# Patient Record
Sex: Male | Born: 1960 | Race: White | Hispanic: No | Marital: Single | Smoking: Current every day smoker
Health system: Southern US, Community
[De-identification: ages and names within clinical notes are randomized; demographics above are authoritative.]

## PROBLEM LIST (undated history)

## (undated) DIAGNOSIS — F319 Bipolar disorder, unspecified: Secondary | ICD-10-CM

## (undated) DIAGNOSIS — G709 Myoneural disorder, unspecified: Secondary | ICD-10-CM

## (undated) DIAGNOSIS — J309 Allergic rhinitis, unspecified: Secondary | ICD-10-CM

## (undated) DIAGNOSIS — I10 Essential (primary) hypertension: Secondary | ICD-10-CM

## (undated) DIAGNOSIS — M4310 Spondylolisthesis, site unspecified: Secondary | ICD-10-CM

## (undated) DIAGNOSIS — K449 Diaphragmatic hernia without obstruction or gangrene: Secondary | ICD-10-CM

## (undated) DIAGNOSIS — C801 Malignant (primary) neoplasm, unspecified: Secondary | ICD-10-CM

## (undated) DIAGNOSIS — M1990 Unspecified osteoarthritis, unspecified site: Secondary | ICD-10-CM

## (undated) DIAGNOSIS — F1420 Cocaine dependence, uncomplicated: Secondary | ICD-10-CM

## (undated) DIAGNOSIS — E291 Testicular hypofunction: Secondary | ICD-10-CM

## (undated) DIAGNOSIS — F988 Other specified behavioral and emotional disorders with onset usually occurring in childhood and adolescence: Secondary | ICD-10-CM

## (undated) DIAGNOSIS — F1994 Other psychoactive substance use, unspecified with psychoactive substance-induced mood disorder: Secondary | ICD-10-CM

## (undated) DIAGNOSIS — E785 Hyperlipidemia, unspecified: Secondary | ICD-10-CM

## (undated) DIAGNOSIS — G4733 Obstructive sleep apnea (adult) (pediatric): Secondary | ICD-10-CM

## (undated) DIAGNOSIS — F1020 Alcohol dependence, uncomplicated: Secondary | ICD-10-CM

## (undated) DIAGNOSIS — M48061 Spinal stenosis, lumbar region without neurogenic claudication: Secondary | ICD-10-CM

## (undated) HISTORY — PX: SHOULDER SURGERY: SHX246

## (undated) HISTORY — PX: OTHER SURGICAL HISTORY: SHX169

---

## 2004-05-11 ENCOUNTER — Ambulatory Visit: Payer: Self-pay | Admitting: Psychiatry

## 2004-05-11 ENCOUNTER — Inpatient Hospital Stay (HOSPITAL_COMMUNITY): Admission: RE | Admit: 2004-05-11 | Discharge: 2004-05-16 | Payer: Self-pay | Admitting: Psychiatry

## 2004-05-23 ENCOUNTER — Inpatient Hospital Stay (HOSPITAL_COMMUNITY): Admission: RE | Admit: 2004-05-23 | Discharge: 2004-05-28 | Payer: Self-pay | Admitting: Psychiatry

## 2008-04-21 ENCOUNTER — Emergency Department (HOSPITAL_COMMUNITY): Admission: EM | Admit: 2008-04-21 | Discharge: 2008-04-22 | Payer: Self-pay | Admitting: Emergency Medicine

## 2008-04-22 ENCOUNTER — Inpatient Hospital Stay (HOSPITAL_COMMUNITY): Admission: EM | Admit: 2008-04-22 | Discharge: 2008-04-29 | Payer: Self-pay | Admitting: Psychiatry

## 2008-04-22 ENCOUNTER — Ambulatory Visit: Payer: Self-pay | Admitting: Psychiatry

## 2010-09-04 NOTE — H&P (Signed)
Colin Kelley, Colin Kelley NO.:  [id number]   MEDICAL RECORD NO.:  [id number]          PATIENT TYPE:  IPS   LOCATION:  0507                          FACILITY:  BH   PHYSICIAN:  Geoffery Lyons, M.D.      DATE OF BIRTH:  21-Jan-1961   DATE OF ADMISSION:  04/22/2008  DATE OF DISCHARGE:                       PSYCHIATRIC ADMISSION ASSESSMENT   IDENTIFYING INFORMATION/JUSTIFICATION FOR ADMISSION AND CARE:  This is a  50 year old single white male who presented to the emergency department  at [facility]. He reported that he was using drugs, living on  the streets, and was suicidal with a plan to overdose or wreck his car.  Today, he is asking for long term substance abuse treatment and he  stated that he relapsed on drugs while in a half way house in [place],  which precipitated his crisis.   CURRENT MEDICATIONS:  He is currently on Zoloft, Neurontin, and an  antihistamine for anxiety. This would be Vistaril.   PAST PSYCHIATRIC HISTORY:  He was most recently at a [facility] half way  house in [place]. He has been treated as an outpatient on and off  since 2000. He reports having had substance abuse issues for at least 15  years.   SOCIAL HISTORY:  He has a GED. He has never married. He has no children.  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 His parents live in  [place] and he expects them to be coming to visit today.   FAMILY HISTORY:  He denies.   ALCOHOL/DRUG HISTORY:  Is already noted. He reports issues with cocaine  for about 15 years.   PRIMARY CARE PHYSICIAN:  He currently does not have one.   PAST MEDICAL HISTORY:  1. Hypertension.  2. Asthma.  3. Sleep apnea. He is in the process of getting a CPAP.   MEDICATIONS:  Coming to [place] from the half way house, he reports:  1. Gabapentin 900 mg p.o. q.h.s.  2. Tuatara 40 mg p.o. each morning.  3. Singulair 10 mg p.o. daily.  4. Benadryl unknown.  5. Vistaril unknown.  6. Lovastatin 4 mg  p.o. daily.  7. Zyrtec 10 mg p.o. daily.  8. Nortriptyline 30 mg q.h.s.  9. Zoloft 50 mg q.h.s.  10.Prilosec 20 mg b.i.d.  11.Albuterol inhaler, no specifications given.   ALLERGIES:  SULFA.   PHYSICAL EXAMINATION:  He was medically cleared in the emergency  department at [facility].  VITAL SIGNS:  On admission to our unit showed he is 5 foot 8. Weight 222  1/4 pounds. Temperature 98.3, blood pressure was 155/104 to 122/80. His  pulse is 107 to 115 and respiratory rate 16 to 20. Temperature  originally was 99.4.   LABORATORY DATA:  His UDS is positive for cocaine. He had no alcohol.  His other labs were unremarkable.   MENTAL STATUS EXAM:  He was seen in his room. He was in bed after lunch.  He was alert and oriented. He was appropriately groomed, dressed, and  nourished. His speech was not pressured. His mood was appropriate to  the  situation. His thought processes were clear, rational, and goal  oriented. He did ask for a long term substance abuse treatment program.  Judgment and insight were fair. Concentration and memory superficially  are intact. Intelligence is average. He is not actively suicidal or  homicidal, although he is not sure that he can contact outside the  hospital yet. That is because he would relapse on drugs. He does not  have any auditory visual hallucinations.   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  AXIS II:    Deferred.  AXIS III:   History for hypertension, asthma, and allergies.  AXIS IV:    Severe, financial and substance abuse issues.  AXIS V:     35.   PLAN:  We will admit for safety and stabilization. His medications will  be adjusted as indicated. Our case manager will help identify post-  discharge placement and treatment.      Mickie Leonarda Salon, P.A.-C.      Geoffery Lyons, M.D.   Electronically Signed    MD/MEDQ  D:  04/23/2008  T:  04/23/2008  Job:  [id number]

## 2010-09-07 NOTE — H&P (Signed)
NAMEJASPER, Colin Kelley NO.:  [id number]   MEDICAL RECORD NO.:  [id number]          PATIENT TYPE:  IPS   LOCATION:  0304                          FACILITY:  BH   PHYSICIAN:  Colin Kelley, M.D. DATE OF BIRTH:  24-Feb-1961   DATE OF ADMISSION:  05/11/2004  DATE OF DISCHARGE:                         PSYCHIATRIC ADMISSION ASSESSMENT   This is a voluntary admission to the services of Dr. Jeanice Kelley.   IDENTIFYING STATEMENT:  This is a 50 year old single white male.  Apparently  he presented to [facility] on Thursday, requesting rehab.  He had relapsed on  cocaine.  He also acknowledged suicidal ideation so they would not admit  him. He then went to the [facility] where he was  assessed and eventually admitted here to the [facility].  The  patient relapsed on crack a few weeks ago. He lost his job. He has had to  move in with his parents.  He is also enrolled at an electrical course at  [employer] at this time. Today, he states that he is not has hopeless. He would  like to be discharged to an [facility].  He wants meds to help with focus  and concentration. He states that that is the reason that he lost his job  and he also has sleep issues.  He is also concerned about lack of libido. He  wants his meds assessed for this.  His admitting glucose was slightly  elevated.  His hemoglobin A1c is also slightly elevated at 6.5.  This was  reviewed with him and he was informed that we will request a consult to see  if he needs to be medicated or if this can just be managed through diet and  exercise.   PAST PSYCHIATRIC HISTORY:  He has had numerous detoxifications and rehabs.  He has had 7 admissions in and around the [place] area.  He was admitted to  [facility] here in [place] about 10 years ago.  He states that he does go to [employer] regularly.   SOCIAL HISTORY:  He went to the tenth grade.  He has a GED.  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  He also has been employed  as a Cabin crew, and things like that. He has never married. He has  no children.   FAMILY HISTORY:  On the maternal side there is depression and both  grandfathers had alcoholism.   ALCOHOL AND DRUG HISTORY:  He began drinking at age 36, cocaine at age 51.   MEDICAL PRIMARY CARE Colin Kelley:  Dr. __________ in [place].   MEDICAL PROBLEMS:  Hypertension, asthma, hypercholesterolemia.  He had  removal of a testicular tumor in 2004 and a prostatectomy in 2005.   CURRENT MEDICATIONS:  1.  Albuterol 2 puffs p.r.n.  2.  Diovan 160/25 daily.  3.  Questor 10 mg daily.  4.  __________  40 mg daily.  5.  Seroquel 100 mg h.s. and Lexapro 20 mg daily.   DRUG ALLERGIES:  His drug allergies are to  SULFA and Naprosyn. He states  that he had a rash.   PHYSICAL EXAMINATION:  Unremarkable.  He does have surgical scars consistent  with his history for prostatectomy and testicular tumor.  Otherwise there  were no remarkable findings today.   MENTAL STATUS EXAM:  He is alert and oriented x3.  He is appropriately  groomed and dressed.  Gait and motor are normal.  Eye contact is good.  His  speech has a normal rate, rhythm, and tone.  It is not pressured.  He states  his mood is medium. His affect shows an appropriate range and it is  appropriate to the situation.  Thought processes were clear, rational, and  goal oriented.  He knows he wants to be discharged to a half-way house.  He  specifically requested an [facility].  Judgment and insight are intact.  Concentration and memory are intact.  Intelligence is at least average.  Today he denies being as suicidal.  He is not homicidal and he denies  auditory or visual hallucinations.   AXIS I:  1.  Alcohol and cocaine dependence, recent relapse. He has been cocaine      dependent for 10-15 years by his admission.  2.  Major depressive disorder, recurrent severe, without  psychosis.   AXIS II:  Deferred.   AXIS III:  1.  Hypertension.  2.  Asthma.  3.  Increased cholesterol.  4.  Status post testicular cancer.   AXIS IV:  1.  Severe problems with primary support group.  2.  Problems related to social environment.  3.  Occupational problem.  4.  Housing problem.  5.  Economic problem.   AXIS V:  20-25.   PLAN:  He will be admitted for safety and stabilization.  We will adjust his  medications as indicated.  Towards that end, a mood disorder questionnaire  was administered and it is highly suggestive that a mood stabilizer might be  indicated.      MD/MEDQ  D:  05/12/2004  T:  05/12/2004  Job:  [id number]

## 2010-09-07 NOTE — Discharge Summary (Signed)
NAMEAKEEL, REFFNER NO.:  [id number]   MEDICAL RECORD NO.:  [id number]          PATIENT TYPE:  IPS   LOCATION:  0303                          FACILITY:  BH   PHYSICIAN:  Jeanice Lim, M.D. DATE OF BIRTH:  May 30, 1960   DATE OF ADMISSION:  05/23/2004  DATE OF DISCHARGE:  05/28/2004                                 DISCHARGE SUMMARY   IDENTIFYING DATA:  This is a 50 year old single Caucasian male voluntarily  admitted.  Presenting with a history of suicidal ideation with no specific  plan.  Overdosed on crack cocaine, three-day binge.  Reported compliance  with medications.   PAST PSYCHIATRIC HISTORY:  This is his second admission here to [facility].  Was here about a week ago.  History of bipolar disorder,  suicidal ideation and crack cocaine use.   SUBSTANCE ABUSE:  Frequent use of crack cocaine.   ALLERGIES:  SULFA medications, ALEVE and SHELLFISH.   PHYSICAL EXAMINATION:  Physical and neurologic exam within normal limits.   LABORATORY DATA:  Routine admission labs within normal limits.   MENTAL STATUS EXAM:  Alert, middle-aged male.  Cooperative.  Fair eye  contact.  Speech clear.  Mood depressed, tired.  Affect restricted.  Thought  processes goal directed.  No psychotic symptoms.  Cognitively intact.  Judgment and insight are fair.  The patient has a history of poor impulse  control and mood instability.   ADMISSION DIAGNOSES:   AXIS I:  1.  Bipolar disorder.  2.  Cocaine dependence versus abuse.  3.  Rule out substance-induced mood disorder superimposed on bipolar      disorder.   AXIS II:  Deferred.   AXIS III:  Hypertension.   AXIS IV:  Moderate (problems with limited support system).   AXIS V:  35/60.   HOSPITAL COURSE:  The patient was admitted and ordered routine p.r.n.  medications and underwent further monitoring.  Was encouraged to participate  in individual, group and milieu therapy.  Was monitored for safety  and  participated in substance abuse treatment.  After a day of being admitted,  he reported being depressed but not actively suicidal.  He reported he saw  his outpatient psychiatrist, Dr. Tomasa Rand, yesterday and now is prescribed  valproic acid.  Affect was flat, very tired, no appetite.  Apparently, the  patient's stay was paid with down payment initially with cash of $1500 due  to being denied further days due to number of days used, so patient has  history of frequent admissions.  On the day after admission, the patient  reported continued suicidal thoughts, passive with no intent.  Reported  that, instead of working his relapse prevention plan and going to a halfway  house, he stayed at his parents, got money and then went and used crack  cocaine on a three-day binge and became very suicidal.  Reported that he has  now been diagnosed bipolar, which there is history of previous diagnosis but  patient was adamant he was not bipolar in the past.  Now appearing to feel  more comfortable with  his diagnosis.  The patient looked into [facility], site, worked with aftercare arrangements with the casemanagement and was  referred to multiple [employer], halfway houses, three-quarter houses and  recovery work programs since the crack cocaine appears to be his primary  destabilizer.  The patient described feeling irritable and angry, reported  that he did not like Seroquel.  Dr. Milas Hock office was contacted and  plan was apparently to decrease Lexapro and Strattera as well as Seroquel  and increase Depakote and possibly Lamictal.  [facility] was also  contacted.  The patient described feeling sedated with increase in Depakote  but felt some improvement.  Then plans to continue follow-up at [facility]  and was agreeable to go to a 14-day program due to severity of his addiction  and the impact this has on his mood disorder and the impact the mood  disorder may have on his  ability to stay abstinent.  The patient continued  to be somewhat isolative at times, sleeping at some times, feeling mildly  sedated from the Depakote.  Otherwise, showed improvement.  Family session  was held.  The patient reported feeling a little bit better as his mood was  a little more stable and there is decreased irritability with a decrease in  mixed symptoms.  No suicidal or homicidal ideation.  No psychotic symptoms.  No acute cravings.  Increased insight and judgment.  Educated regarding the  importance of compliance with medications and impact of cocaine, life-  threatening in itself and possibly life-threatening due to impact on  medications and mood.  The patient verbally appeared to be motivated at the  time of the previous discharge. However, is agreeing to residential at this  time.   CONDITION ON DISCHARGE:  He was discharged in improved condition.  Mood was  more euthymic, less irritability, no mood swings, no racing thoughts, no  report of significant depressive symptoms.  No suicidal or homicidal  ideation.  No psychotic symptoms.  Improved judgment and insight.  Thought  processes very goal directed and reality-based.  Able to problem-solve.  Given medication education including importance of compliance with  medications and monitoring with Depakote.   DISCHARGE MEDICATIONS:  1.  Protonix 40 mg a day.  2.  Diovan 160 mg.  3.  Hydrochlorothiazide 25 mg daily.  4.  Claritin 10 mg daily.  5.  Neurontin 300 mg, 2 q.h.s.  6.  Lexapro decreased to 10 mg daily.  The patient was reluctant to come off      of this and wanted to be continued on a higher dose.  However, he was      advised, to decrease mood instability and possible induction, it would      be best to be on a minimal dose and possibly consider decreasing it to 5      mg after being seen by his outpatient psychiatrist.  7.  Nasonex 1 spray each nostril daily as previously directed. 8.  Depakote 500 mg,  taking 2 q.h.s.  This may need to gradually be titrated      but this could not be titrated further inpatient due to patient's      sedation.   FOLLOW UP:  However, to retake therapeutic level depending on mood  instability, the patient may benefit from further gradual increases in dose  and close monitoring due to mood instability as well as intensive substance  abuse treatment, which he will get a [facility]  [place], dual-diagnosis  residential program and then will likely need a halfway house as a stepdown,  continued psychiatric follow-up in addition to individual therapy, focusing  on substances as well as mood disorder.   DISCHARGE DIAGNOSES:   AXIS I:  1.  Bipolar disorder, type 1, mixed state.  2.  Cocaine abuse with history of possible dependence.  3.  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   AXIS II:  Deferred.   AXIS III:  Hypertension.   AXIS IV:  Moderate (problems with limited support system).   AXIS V:  Global Assessment of Functioning on discharge 55-60.      JEM/MEDQ  D:  06/24/2004  T:  06/25/2004  Job:  [id number]

## 2010-09-07 NOTE — Discharge Summary (Signed)
Colin Kelley, Colin Kelley NO.:  [id number]   MEDICAL RECORD NO.:  [id number]          PATIENT TYPE:  IPS   LOCATION:  0507                          FACILITY:  BH   PHYSICIAN:  Geoffery Lyons, M.D.      DATE OF BIRTH:  03-Nov-1960   DATE OF ADMISSION:  04/22/2008  DATE OF DISCHARGE:  04/29/2008                               DISCHARGE SUMMARY   CHIEF COMPLAINT:  This was the first admission to [facility] for this 50 year old single white male who presented to the  emergency department.  He was using drugs, living on the streets, was  suicidal.  Planned to overdose or wreck his car.  At the time of this  assessment, asking for long term substance abuse treatment.  He relapsed  on drugs while in a halfway house in [place], which precipitated his  crisis.   PAST PSYCHIATRIC HISTORY:  Has been treated as an outpatient on and off  since 2000.  Had been prescribed Zoloft, Neurontin and Vistaril for  anxiety.   SECONDARY HISTORY:  Has had issues with substance abuse for at least 15  years, mostly cocaine.   MEDICAL HISTORY:  Hypertension, asthma, sleep apnea.   MEDICATIONS:  1. Neurontin 900 mg at night.  2. Singulair 10 mg per day.  3. Benadryl.  4. Vistaril as needed.  5. Lovastatin 4 mg per day.  6. Zyrtec 10 mg per day.  7. Nortriptyline 30 mg at night.  8. Zoloft 50 mg at night.  9. Prilosec 20 mg twice a day.  10.Albuterol as needed.  11.Strattera 40 mg per day.   EXAM:  Failed to show any acute findings.   LABORATORY WORK:  UDS positive for cocaine.  Other labs were within  normal limits..   MENTAL STATUS EXAM:  Reveals alert cooperative male in bed but alert,  oriented, appropriately groomed, dressed and nourished.  Speech was  normal in the in rate, tempo and production.  Mood is anxious,  depressed.  Affect broad.  Thought processes logical, coherent and  relevant.  No active delusions.  No active suicidal ideas, no  hallucinations.  Cognition well preserved.   ADMITTING DIAGNOSES:  AXIS I:  Alcohol and cocaine dependence, major  depressive disorder.  AXIS II:  No diagnosis.  AXIS III:  Hypertension, asthma, allergies.  AXIS IV:  Moderate.  AXIS V:  On admission 35.  Highest GAF in the last year, 60.   COURSE IN THE HOSPITAL:  He was admitted.  He was started individual and  group psychotherapy.  He was maintained on his medications, Abilify was  placed at 10 mg in the morning.  He was started on Lamictal 25 mg per  day.  As already stated, a relapse on drugs while in a halfway house in  [place].  As the hospitalization progressed, he reports poor sleep.  He endorsed that he was dealing with a lot of stress.  Endorsed he has  issues with the substances, frequent relapses, multiple rehabs, multiple  medication trials.  Has been on Paxil, Prozac, Zoloft, Celexa, Lexapro,  Effexor, Wellbutrin, Cymbalta, Seroquel, Neurontin.  Endorsed he gets  really agitated when the dose of the antidepressant is increased.  She  cannot tolerate the right dosage but does well on the lower ones and he  admitted to issues with work, anxiety, also longer term issues with  sexual identity, as well as not being able to commit to people.  He felt  that the Abilify was not agreeing with him, producing agitation even in  the morning, and endorsed that he counteracts whatever Strattera is  trying to do for him.  Difficult time with attention span.  Had been  applying to [facility].  January 6, endorsed he was better off the Abilify,  waiting for some response from [facility] or [facility].  Endorsed that he  really needs to make it to residential treatment program.  Continued to  endorse he was better off the Abilify.  Some lack of energy and  motivation, but overall getting better.  We worked on Pharmacologist and  relapse prevention.  January 8 he was in full contact with reality.  He  got a response from [facility] that he could go in.   He was encouraged by  this, motivated.  He was discharged.  Family was going to take him to  [facility] to get his things and take him to [facility] in [place]  for a long-term rehab.   DISCHARGE DIAGNOSES:  AXIS I: Cocaine dependence.  Mood disorder NOS.  AXIS II:  No diagnosis.  AXIS III:  Hypertension, asthma, allergies.  AXIS IV:  Moderate.  AXIS V:  On discharge 55.   DISCHARGE MEDICATIONS:  1. Neurontin 300 three times a day.  2. Zoloft 50 mg at bedtime.  3. Singulair 10 mg per day.  4. Claritin 10 mg per day.  5. Protonix 40 mg twice a day.  6. Zocor 20 mg per day.  7. Lisinopril 20 mg per day.  8. Lamictal 25 mg per day.   FOLLOW UP:  [facility] in [place].      Geoffery Lyons, M.D.  Electronically Signed     IL/MEDQ  D:  05/19/2008  T:  05/19/2008  Job:  [id number]

## 2010-09-07 NOTE — Discharge Summary (Signed)
NAMENYXON, STRUPP NO.:  [id number]   MEDICAL RECORD NO.:  [id number]          PATIENT TYPE:  IPS   LOCATION:  0303                          FACILITY:  [facility]   PHYSICIAN:  Jeanice Lim, M.D. DATE OF BIRTH:  [birth year]   DATE OF ADMISSION:  05/23/2004  DATE OF DISCHARGE:  05/28/2004                                 DISCHARGE SUMMARY   IDENTIFYING DATA:  This is a 50 year old single, Caucasian male presenting  to [facility] on Thursday requesting rehab.  Relapsed on cocaine.  Acknowledge  suicidal ideation.  They did not admit him.  Went to [facility] and essentially moved to [facility].  Relapsed  on crack a few weeks ago.  Lost a job.  Had to move in with his parents.  Would like to be discharged to an [facility].  Reporting motivation to  become abstinent.   PAST PSYCHIATRIC HISTORY:  Numerous detoxes and rehabs.  Seven admissions  around the [facility] area and admitted to [facility] here in [place] 10 years ago.  Reported he does attend AA meetings.   ALCOHOL/DRUG HISTORY:  Began drinking at age 69.  Cocaine age 16.   CURRENT MEDICATIONS:  Albuterol, Diovan, Crestor, Protonix, Seroquel and  Lexapro.   ALLERGIES:  SULFA medications and NAPROSYN.   PHYSICAL EXAMINATION:  Physical and neurologic exam essentially within  normal limits.   LABORATORY DATA:  Routine admission labs essentially within normal limits.   MENTAL STATUS EXAM:  Alert and oriented.  Appropriately dressed and groomed.  Gait within normal limits.  Eye contact good.  Speech within normal limits,  not pressured.  Mood depressed.  Affect appropriate.  Thought processes goal  directed.  Specifically requesting [employer].  Appearing to have insight  regarding the severity of his addiction and impact on life.  Denied being  suicidal 24-48 hours after admission and denied homicidal or psychotic  symptoms.   ADMISSION DIAGNOSES:   AXIS I:  1.  Alcohol  and cocaine dependence.  2.  Recent relapse.  3.  Cocaine dependent for 10-15 years by history.  4.  Major depressive disorder, recurrent, severe without psychosis.  5.  Rule out substance-induced mood disorder.   AXIS II:  Deferred.   AXIS III:  1.  Hypertension.  2.  Asthma.  3.  Elevated cholesterol.  4.  Status post testicular cancer.   AXIS IV:  Severe (problems with primary support group, related to social  environment, occupational, housing, economic, medical).   AXIS V:  25/55.   HOSPITAL COURSE:  The patient was admitted and ordered routine p.r.n.  medications and underwent further monitoring.  Was encouraged to participate  in individual, group and milieu therapy.  The patient was monitored for  safety and participated in substance abuse therapy, developing a relapse  prevention plan.  Was placed on low dose Librium detox protocol and  monitored for safety.  Tolerated this.  Was restarted on medical medication  and monitored closely medically as well as psychiatrically.  CIWAs were  checked on a frequent basis and Neurontin  used for pain and sleep cycle  restored.  Acyclovir and Abreva for a coldsore.  Seroquel was decreased to  lowest effective dose and Neurontin optimized.  The patient reported  positive response to medication changes.   CONDITION ON DISCHARGE:  Discharged in improved condition.  Mood was  euthymic.  Affect full.  Thought processes goal directed.  Thought content  negative for dangerous ideation or psychotic symptoms.  The patient reported  motivation to be compliant with the aftercare plan.   DISCHARGE MEDICATIONS:  1.  Seroquel 25 mg at 9 p.m.  2.  Nasonex 2 sprays q.d.  3.  Motrin 600 mg t.i.d.  4.  Percocet 2 q.8h. p.r.n. pain.  5.  Ultram 50 mg, 2 q.8h. p.r.n. pain.  6.  Lexapro 20 mg q.a.m.  7.  Protonix 40 mg q.a.m.  8.  Neurontin 300 mg, 2 at 9 p.m.  9.  Strattera 60 mg q.a.m.  10. Zyrtec 10 mg q.a.m.  11. Avapro 300 mg q.a.m.  12.  Hydrochlorothiazide q.a.m.  13. Zocor 40 mg at 6 p.m.   FOLLOW UP:  The patient was to follow up with Dr. Tomasa Rand on Tuesday,  January 31st at 10 a.m. and Eyvonne Mechanic on May 25, 2004 at 2:30 p.m.   DISCHARGE DIAGNOSES:   AXIS I:  1.  Alcohol and cocaine dependence.  2.  Recent relapse.  3.  Cocaine dependent for 10-15 years by history.  4.  Major depressive disorder, recurrent, severe without psychosis.  5.  Rule out substance-induced mood disorder.   AXIS II:  Deferred.   AXIS III:  1.  Hypertension.  2.  Asthma.  3.  Elevated cholesterol.  4.  Status post testicular cancer.   AXIS IV:  Severe (problems with primary support group, related to social  environment, occupational, housing, economic, medical).   AXIS V:  Global Assessment of Functioning on discharge 55.      JEM/MEDQ  D:  06/20/2004  T:  06/21/2004  Job:  [id number]

## 2011-01-25 LAB — POCT I-STAT, CHEM 8
BUN: 29 mg/dL — ABNORMAL HIGH (ref 6–23)
Calcium, Ion: 1.19 mmol/L (ref 1.12–1.32)
Glucose, Bld: 93 mg/dL (ref 70–99)
TCO2: 22 mmol/L (ref 0–100)

## 2011-01-25 LAB — RAPID URINE DRUG SCREEN, HOSP PERFORMED
Barbiturates: NOT DETECTED
Cocaine: POSITIVE — AB
Opiates: NOT DETECTED

## 2011-01-25 LAB — DIFFERENTIAL
Basophils Absolute: 0.1 10*3/uL (ref 0.0–0.1)
Basophils Relative: 1 % (ref 0–1)
Eosinophils Relative: 0 % (ref 0–5)
Lymphocytes Relative: 24 % (ref 12–46)
Monocytes Absolute: 0.7 10*3/uL (ref 0.1–1.0)
Monocytes Relative: 6 % (ref 3–12)

## 2011-01-25 LAB — CBC
HCT: 47.9 % (ref 39.0–52.0)
Hemoglobin: 16.4 g/dL (ref 13.0–17.0)
MCHC: 34.2 g/dL (ref 30.0–36.0)
RDW: 12.7 % (ref 11.5–15.5)

## 2015-09-13 ENCOUNTER — Emergency Department (HOSPITAL_COMMUNITY): Payer: BLUE CROSS/BLUE SHIELD

## 2015-09-13 ENCOUNTER — Encounter (HOSPITAL_COMMUNITY): Payer: Self-pay | Admitting: Emergency Medicine

## 2015-09-13 ENCOUNTER — Emergency Department (HOSPITAL_COMMUNITY)
Admission: EM | Admit: 2015-09-13 | Discharge: 2015-09-13 | Disposition: A | Discharge status: Home / Self Care | Payer: BLUE CROSS/BLUE SHIELD | Attending: Emergency Medicine | Admitting: Emergency Medicine

## 2015-09-13 DIAGNOSIS — S82852A Displaced trimalleolar fracture of left lower leg, initial encounter for closed fracture: Secondary | ICD-10-CM | POA: Insufficient documentation

## 2015-09-13 DIAGNOSIS — Z7951 Long term (current) use of inhaled steroids: Secondary | ICD-10-CM | POA: Diagnosis not present

## 2015-09-13 DIAGNOSIS — I10 Essential (primary) hypertension: Secondary | ICD-10-CM | POA: Diagnosis not present

## 2015-09-13 DIAGNOSIS — W11XXXA Fall on and from ladder, initial encounter: Secondary | ICD-10-CM | POA: Diagnosis not present

## 2015-09-13 DIAGNOSIS — Z79899 Other long term (current) drug therapy: Secondary | ICD-10-CM | POA: Diagnosis not present

## 2015-09-13 DIAGNOSIS — Y9389 Activity, other specified: Secondary | ICD-10-CM | POA: Diagnosis not present

## 2015-09-13 DIAGNOSIS — Y9289 Other specified places as the place of occurrence of the external cause: Secondary | ICD-10-CM | POA: Insufficient documentation

## 2015-09-13 DIAGNOSIS — Z8709 Personal history of other diseases of the respiratory system: Secondary | ICD-10-CM | POA: Insufficient documentation

## 2015-09-13 DIAGNOSIS — Z8739 Personal history of other diseases of the musculoskeletal system and connective tissue: Secondary | ICD-10-CM | POA: Insufficient documentation

## 2015-09-13 DIAGNOSIS — T1490XA Injury, unspecified, initial encounter: Secondary | ICD-10-CM

## 2015-09-13 DIAGNOSIS — Z8669 Personal history of other diseases of the nervous system and sense organs: Secondary | ICD-10-CM | POA: Insufficient documentation

## 2015-09-13 DIAGNOSIS — S82853A Displaced trimalleolar fracture of unspecified lower leg, initial encounter for closed fracture: Secondary | ICD-10-CM

## 2015-09-13 DIAGNOSIS — Z8639 Personal history of other endocrine, nutritional and metabolic disease: Secondary | ICD-10-CM | POA: Insufficient documentation

## 2015-09-13 DIAGNOSIS — Z8659 Personal history of other mental and behavioral disorders: Secondary | ICD-10-CM | POA: Insufficient documentation

## 2015-09-13 DIAGNOSIS — Y998 Other external cause status: Secondary | ICD-10-CM | POA: Diagnosis not present

## 2015-09-13 DIAGNOSIS — Z87891 Personal history of nicotine dependence: Secondary | ICD-10-CM | POA: Insufficient documentation

## 2015-09-13 DIAGNOSIS — S9305XA Dislocation of left ankle joint, initial encounter: Secondary | ICD-10-CM | POA: Insufficient documentation

## 2015-09-13 DIAGNOSIS — S99912A Unspecified injury of left ankle, initial encounter: Secondary | ICD-10-CM | POA: Diagnosis present

## 2015-09-13 HISTORY — DX: Alcohol dependence, uncomplicated: F10.20

## 2015-09-13 HISTORY — DX: Other specified behavioral and emotional disorders with onset usually occurring in childhood and adolescence: F98.8

## 2015-09-13 HISTORY — DX: Allergic rhinitis, unspecified: J30.9

## 2015-09-13 HISTORY — DX: Testicular hypofunction: E29.1

## 2015-09-13 HISTORY — DX: Cocaine dependence, uncomplicated: F14.20

## 2015-09-13 HISTORY — DX: Diaphragmatic hernia without obstruction or gangrene: K44.9

## 2015-09-13 HISTORY — DX: Hyperlipidemia, unspecified: E78.5

## 2015-09-13 HISTORY — DX: Other psychoactive substance use, unspecified with psychoactive substance-induced mood disorder: F19.94

## 2015-09-13 HISTORY — DX: Spondylolisthesis, site unspecified: M43.10

## 2015-09-13 HISTORY — DX: Obstructive sleep apnea (adult) (pediatric): G47.33

## 2015-09-13 HISTORY — DX: Essential (primary) hypertension: I10

## 2015-09-13 HISTORY — DX: Spinal stenosis, lumbar region without neurogenic claudication: M48.061

## 2015-09-13 MED ORDER — HYDROCODONE-ACETAMINOPHEN 5-325 MG PO TABS: 1.0000 | ORAL_TABLET | ORAL | Status: DC | PRN

## 2015-09-13 MED ORDER — PROPOFOL 10 MG/ML IV BOLUS
50.0000 mg | INTRAVENOUS | Status: DC | PRN
  Administered 2015-09-13: 50 mg via INTRAVENOUS
  Filled 2015-09-13: qty 20

## 2015-09-13 MED ORDER — HYDROMORPHONE HCL 1 MG/ML IJ SOLN
1.0000 mg | INTRAMUSCULAR | Status: AC | PRN
  Administered 2015-09-13 (×3): 1 mg via INTRAVENOUS
  Filled 2015-09-13 (×3): qty 1

## 2015-09-13 MED ORDER — SODIUM CHLORIDE 0.9 % IV SOLN
INTRAVENOUS | Status: DC
  Administered 2015-09-13: 21:00:00 via INTRAVENOUS

## 2015-09-13 NOTE — Progress Notes (Signed)
Orthopedic Tech Progress Note Patient Details:  Colin Kelley 17-Jul-1960 [id number]  Ortho Devices Type of Ortho Device: Stirrup splint, Post (short leg) splint Ortho Device/Splint Location: lle posterior and stirrup. assist dr with ankle reduction. Ortho Device/Splint Interventions: Ordered, Application   Karolee Stamps 09/13/2015, 10:53 PM

## 2015-09-13 NOTE — ED Provider Notes (Signed)
CSN: [id number]     Arrival date & time 09/13/15  2040 History   First MD Initiated Contact with Patient 09/13/15 2044     Chief Complaint  Patient presents with  . Foot Injury   HPI Patient presents to the emergency room for evaluation of the left ankle injury. The patient was working on a ladder when he accidently fell off and injured his left ankle. Patient noted a deformity. He was unable to put any weight on it he called EMS. EMS noted a left ankle deformity and placed him in a splint. He was given 200 g of fentanyl. Patient was transported to the emergency room for further evaluation Past Medical History  Diagnosis Date  . Hypertension   . Substance induced mood disorder ([place])   . 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 Attention deficit disorder without hyperactivity   . Dyslipidemia, goal LDL below 130   . Allergic rhinitis   . Hypogonadism male   . OSA (obstructive sleep apnea)   . Spinal stenosis of lumbar region without neurogenic claudication   . Spondylolisthesis   . Cocaine use disorder, severe, dependence ([place])   . Alcohol use disorder, severe, in controlled environment [facility])    Past Surgical History  Procedure Laterality Date  . Prostectomy    . Shoulder surgery     History reviewed. No pertinent family history. Social History  Substance Use Topics  . Smoking status: Former Smoker    Quit date: 09/13/2014  . Smokeless tobacco: Current User    Types: Chew  . Alcohol Use: No    Review of Systems  All other systems reviewed and are negative.     Allergies  Shellfish-derived products; Fructose; and Sulfa antibiotics  Home Medications   Prior to Admission medications   Medication Sig Start Date End Date Taking? Authorizing Provider  albuterol (PROAIR HFA) 108 (90 Base) MCG/ACT inhaler Inhale 2 puffs into the lungs every 6 (six) hours as needed. Shortness of breath 05/29/11  Yes Historical Provider, MD  azelastine (ASTELIN) 0.1 % nasal  spray Place 2 sprays into both nostrils daily. 07/11/15  Yes Historical Provider, MD  fluticasone furoate-vilanterol (BREO ELLIPTA) 100-25 MCG/INH AEPB Inhale 1 puff into the lungs daily. 07/11/15  Yes Historical Provider, MD  gabapentin (NEURONTIN) 800 MG tablet Take 800 mg by mouth 2 (two) times daily. 08/08/15  Yes Historical Provider, MD  lisinopril (PRINIVIL,ZESTRIL) 40 MG tablet Take 40 mg by mouth daily. 09/05/15 09/04/16 Yes Historical Provider, MD  lithium carbonate 300 MG capsule Take 300-600 mg by mouth daily. Take 300 mg every morning Take 600 mg at bedtime 07/03/15  Yes Historical Provider, MD  Phosphatidylserine-DHA-EPA (VAYACOG) 100-19.5-6.5 MG CAPS Take 1 capsule by mouth daily. 08/10/15  Yes Historical Provider, MD  QUEtiapine (SEROQUEL) 50 MG tablet Take 50 mg by mouth at bedtime. 08/25/15  Yes Historical Provider, MD   BP 146/95 mmHg  Pulse 79  Temp(Src) 97.8 F (36.6 C) (Oral)  Resp 20  Ht 5\' 7"  (1.702 m)  Wt 106.595 kg  BMI 36.80 kg/m2  SpO2 100% Physical Exam  Constitutional: He appears well-developed and well-nourished. No distress.  HENT:  Head: Normocephalic and atraumatic.  Right Ear: External ear normal.  Left Ear: External ear normal.  Eyes: Conjunctivae are normal. Right eye exhibits no discharge. Left eye exhibits no discharge. No scleral icterus.  Neck: Neck supple. No tracheal deviation present.  Cardiovascular: Normal rate and regular rhythm.   Pulmonary/Chest: Effort normal and breath  sounds normal. No stridor. No respiratory distress.  Abdominal: Soft. Bowel sounds are normal. He exhibits no distension. There is no tenderness. There is no rebound.  Musculoskeletal: He exhibits no edema.       Right knee: Normal.       Left knee: Normal.       Right ankle: Normal.       Left ankle: He exhibits decreased range of motion, swelling and deformity. He exhibits no laceration and normal pulse. Tenderness. Lateral malleolus and medial malleolus tenderness found.   Neurological: He is alert. Cranial nerve deficit: no gross deficits.  Skin: Skin is warm and dry. No rash noted.  Psychiatric: He has a normal mood and affect.  Nursing note and vitals reviewed.   ED Course  .Sedation Date/Time: 09/13/2015 9:49 PM Performed by: Dorie Rank Authorized by: Dorie Rank  Consent:    Consent obtained:  Verbal   Consent given by:  Patient   Alternatives discussed:  Analgesia without sedation Indications:    Sedation purpose:  Fracture reduction   Procedure necessitating sedation performed by:  Physician performing sedation   Intended level of sedation:  Deep Pre-sedation assessment:    Time since last food or drink:  5 hours   ASA classification: class 2 - patient with mild systemic disease     Neck mobility: normal     Mouth opening:  3 or more finger widths   Thyromental distance:  4 finger widths   Mallampati score:  I - soft palate, uvula, fauces, pillars visible   Pre-sedation assessments completed and reviewed: airway patency, cardiovascular function, hydration status, mental status, nausea/vomiting, pain level, respiratory function and temperature     Pre-sedation assessment completed:  09/13/2015 9:55 PM Immediate pre-procedure details:    Reassessment: Patient reassessed immediately prior to procedure     Reviewed: vital signs and NPO status     Verified: bag valve mask available, emergency equipment available, IV patency confirmed, oxygen available and suction available   Procedure details (see MAR for exact dosages):    Sedation start time:  09/13/2015 10:15 PM   Preoxygenation:  Nasal cannula   Sedation:  Propofol   Intra-procedure monitoring:  Blood pressure monitoring, cardiac monitor, continuous pulse oximetry, frequent LOC assessments and frequent vital sign checks   Intra-procedure events: none     Sedation end time:  09/13/2015 10:23 PM   Total sedation time (minutes):  8 Post-procedure details:    Post-sedation assessment completed:   09/13/2015 11:09 PM   Recovery: Patient returned to pre-procedure baseline     Post-sedation assessments completed and reviewed: airway patency, cardiovascular function, hydration status, mental status, nausea/vomiting, pain level, respiratory function and temperature     Patient is stable for discharge or admission: Yes     Patient tolerance:  Tolerated well, no immediate complications Reduction of fracture Date/Time: 09/13/2015 11:10 PM Performed by: Dorie Rank Authorized by: Dorie Rank Consent: Verbal consent obtained. Risks and benefits: risks, benefits and alternatives were discussed Consent given by: patient Time out: Immediately prior to procedure a "time out" was called to verify the correct patient, procedure, equipment, support staff and site/side marked as required. Patient sedated: yes Patient tolerance: Patient tolerated the procedure well with no immediate complications Comments: Reduction by Dr Tamala Julian and myself.  Pt tolerated well.  Splinted post procedure    Labs Review Labs Reviewed - No data to display  Imaging Review Dg Ankle Left Port  09/13/2015  CLINICAL DATA:  Status post reduction of trimalleolar  fracture. Initial encounter. EXAM: PORTABLE LEFT ANKLE - 2 VIEW COMPARISON:  Left ankle radiographs performed earlier today at 9:30 p.m. FINDINGS: There is improved alignment of the trimalleolar fracture. The posterior malleolar fragment is no longer well seen. There has been successful reduction of the comminuted distal fibular diaphyseal fracture. There is improved though persistent displacement of the medial malleolar fracture, with residual lateral tilt of the talus, lodged against the lateral aspect of the tibial plafond. A plantar calcaneal spur is noted. The soft tissues are difficult to fully assess due to the surrounding splint. IMPRESSION: Improved alignment of the trimalleolar fracture. Posterior malleolar fragment no longer well seen. Successful reduction of  comminuted distal fibular diaphyseal fracture. Improved though persistent displacement of the medial malleolar fracture, with residual lateral tilt of the talus, lodged against the lateral aspect of the tibial plafond. Electronically Signed   By: Garald Balding M.D.   On: 09/13/2015 22:53   Dg Ankle Left Port  09/13/2015  CLINICAL DATA:  Left ankle pain and deformity after injury. Fall off 8 ft ladder while painting today. EXAM: PORTABLE LEFT ANKLE - 2 VIEW COMPARISON:  None. FINDINGS: Displaced trimalleolar fracture. Transverse fracture through the medial malleolus, distal fragment remains aligned with the talus. Displaced posterior tibial tubercle fracture. Comminuted displaced distal fibular fracture proximal to the ankle mortise. Significant lateral talar subluxation and apex medial talar tilt. Associated soft tissue edema. There is a plantar calcaneal spur. IMPRESSION: Significantly displaced trimalleolar fracture with significant lateral talar subluxation and talar tilt. Electronically Signed   By: Jeb Levering M.D.   On: 09/13/2015 21:50   I have personally reviewed and evaluated these images and lab results as part of my medical decision-making.    MDM   Final diagnoses:  Injury  Trimalleolar fracture of ankle, closed   Pt tolerated procedure.  Better alignment of the ankle fracture.  Will splint.  Crutches.  Outpatient follow up with orthopedics,  Dr Minerva Ends, MD 09/14/15 1018

## 2015-09-13 NOTE — ED Notes (Signed)
Pt arrives by EMS post fall. Pt was painting on approximately 4ft ladder. Fell off, landed on bottom and left side. Obvious deformity to left ankle reported by EMS, positive pulses in left extremity. Pain rated 8/10. EMS placed 18g in right AC, gave a total of 200 mcg Fentanyl. O2 dropped a little so they put him on 2L.

## 2015-09-13 NOTE — Discharge Instructions (Signed)
Ankle Fracture A fracture is a break in a bone. The ankle joint is made up of three bones. These include the lower (distal)sections of your lower leg bones, called the tibia and fibula, along with a bone in your foot, called the talus. Depending on how bad the break is and if more than one ankle joint bone is broken, a cast or splint is used to protect and keep your injured bone from moving while it heals. Sometimes, surgery is required to help the fracture heal properly.  There are two general types of fractures:  Stable fracture. This includes a single fracture line through one bone, with no injury to ankle ligaments. A fracture of the talus that does not have any displacement (movement of the bone on either side of the fracture line) is also stable.  Unstable fracture. This includes more than one fracture line through one or more bones in the ankle joint. It also includes fractures that have displacement of the bone on either side of the fracture line. CAUSES  A direct blow to the ankle.   Quickly and severely twisting your ankle.  Trauma, such as a car accident or falling from a significant height. RISK FACTORS You may be at a higher risk of ankle fracture if:  You have certain medical conditions.  You are involved in high-impact sports.  You are involved in a high-impact car accident. SIGNS AND SYMPTOMS   Tender and swollen ankle.  Bruising around the injured ankle.  Pain on movement of the ankle.  Difficulty walking or putting weight on the ankle.  A cold foot below the site of the ankle injury. This can occur if the blood vessels passing through your injured ankle were also damaged.  Numbness in the foot below the site of the ankle injury. DIAGNOSIS  An ankle fracture is usually diagnosed with a physical exam and X-rays. A CT scan may also be required for complex fractures. TREATMENT  Stable fractures are treated with a cast or splint and using crutches to avoid putting  weight on your injured ankle. This is followed by an ankle strengthening program. Some patients require a special type of cast, depending on other medical problems they may have. Unstable fractures require surgery to ensure the bones heal properly. Your health care provider will tell you what type of fracture you have and the best treatment for your condition. HOME CARE INSTRUCTIONS   Review correct crutch use with your health care provider and use your crutches as directed. Safe use of crutches is extremely important. Misuse of crutches can cause you to fall or cause injury to nerves in your hands or armpits.  Do not put weight or pressure on the injured ankle until directed by your health care provider.  To lessen the swelling, keep the injured leg elevated while sitting or lying down.  Apply ice to the injured area:  Put ice in a plastic bag.  Place a towel between your cast and the bag.  Leave the ice on for 20 minutes, 2-3 times a day.  If you have a plaster or fiberglass cast:  Do not try to scratch the skin under the cast with any objects. This can increase your risk of skin infection.  Check the skin around the cast every day. You may put lotion on any red or sore areas.  Keep your cast dry and clean.  If you have a plaster splint:  Wear the splint as directed.  You may loosen the elastic   around the splint if your toes become numb, tingle, or turn cold or blue.  Do not put pressure on any part of your cast or splint; it may break. Rest your cast only on a pillow the first 24 hours until it is fully hardened.  Your cast or splint can be protected during bathing with a plastic bag sealed to your skin with medical tape. Do not lower the cast or splint into water.  Take medicines as directed by your health care provider. Only take over-the-counter or prescription medicines for pain, discomfort, or fever as directed by your health care provider.  Do not drive a vehicle until  your health care provider specifically tells you it is safe to do so.  If your health care provider has given you a follow-up appointment, it is very important to keep that appointment. Not keeping the appointment could result in a chronic or permanent injury, pain, and disability. If you have any problem keeping the appointment, call the facility for assistance. SEEK MEDICAL CARE IF: You develop increased swelling or discomfort. SEEK IMMEDIATE MEDICAL CARE IF:   Your cast gets damaged or breaks.  You have continued severe pain.  You develop new pain or swelling after the cast was put on.  Your skin or toenails below the injury turn blue or gray.  Your skin or toenails below the injury feel cold, numb, or have loss of sensitivity to touch.  There is a bad smell or pus draining from under the cast. MAKE SURE YOU:   Understand these instructions.  Will watch your condition.  Will get help right away if you are not doing well or get worse.   This information is not intended to replace advice given to you by your health care provider. Make sure you discuss any questions you have with your health care provider.   Document Released: 04/05/2000 Document Revised: 04/13/2013 Document Reviewed: 11/05/2012 Elsevier Interactive Patient Education 2016 [employer]  

## 2016-11-28 DIAGNOSIS — R3914 Feeling of incomplete bladder emptying: Secondary | ICD-10-CM | POA: Diagnosis not present

## 2016-11-28 DIAGNOSIS — M62838 Other muscle spasm: Secondary | ICD-10-CM | POA: Diagnosis not present

## 2016-11-28 DIAGNOSIS — M6281 Muscle weakness (generalized): Secondary | ICD-10-CM | POA: Diagnosis not present

## 2016-11-28 DIAGNOSIS — R152 Fecal urgency: Secondary | ICD-10-CM | POA: Diagnosis not present

## 2016-11-28 DIAGNOSIS — R3916 Straining to void: Secondary | ICD-10-CM | POA: Diagnosis not present

## 2016-12-06 DIAGNOSIS — F3181 Bipolar II disorder: Secondary | ICD-10-CM | POA: Diagnosis not present

## 2016-12-08 ENCOUNTER — Emergency Department (HOSPITAL_COMMUNITY): Payer: Medicare Other

## 2016-12-08 ENCOUNTER — Inpatient Hospital Stay (HOSPITAL_COMMUNITY)
Admission: EM | Admit: 2016-12-08 | Discharge: 2016-12-12 | DRG: 917 | Disposition: A | Discharge status: Home Health | Payer: Medicare Other | Attending: Internal Medicine | Admitting: Internal Medicine

## 2016-12-08 DIAGNOSIS — Z6832 Body mass index (BMI) 32.0-32.9, adult: Secondary | ICD-10-CM | POA: Diagnosis not present

## 2016-12-08 DIAGNOSIS — E785 Hyperlipidemia, unspecified: Secondary | ICD-10-CM | POA: Diagnosis not present

## 2016-12-08 DIAGNOSIS — T50904A Poisoning by unspecified drugs, medicaments and biological substances, undetermined, initial encounter: Secondary | ICD-10-CM | POA: Diagnosis not present

## 2016-12-08 DIAGNOSIS — L89891 Pressure ulcer of other site, stage 1: Secondary | ICD-10-CM | POA: Diagnosis not present

## 2016-12-08 DIAGNOSIS — T56894A Toxic effect of other metals, undetermined, initial encounter: Secondary | ICD-10-CM

## 2016-12-08 DIAGNOSIS — N179 Acute kidney failure, unspecified: Secondary | ICD-10-CM

## 2016-12-08 DIAGNOSIS — Z781 Physical restraint status: Secondary | ICD-10-CM | POA: Diagnosis not present

## 2016-12-08 DIAGNOSIS — G9341 Metabolic encephalopathy: Secondary | ICD-10-CM | POA: Diagnosis present

## 2016-12-08 DIAGNOSIS — G4733 Obstructive sleep apnea (adult) (pediatric): Secondary | ICD-10-CM | POA: Diagnosis present

## 2016-12-08 DIAGNOSIS — R4182 Altered mental status, unspecified: Secondary | ICD-10-CM

## 2016-12-08 DIAGNOSIS — F319 Bipolar disorder, unspecified: Secondary | ICD-10-CM | POA: Diagnosis not present

## 2016-12-08 DIAGNOSIS — Z881 Allergy status to other antibiotic agents status: Secondary | ICD-10-CM

## 2016-12-08 DIAGNOSIS — T43591A Poisoning by other antipsychotics and neuroleptics, accidental (unintentional), initial encounter: Secondary | ICD-10-CM | POA: Diagnosis present

## 2016-12-08 DIAGNOSIS — R2681 Unsteadiness on feet: Secondary | ICD-10-CM | POA: Diagnosis present

## 2016-12-08 DIAGNOSIS — Y92009 Unspecified place in unspecified non-institutional (private) residence as the place of occurrence of the external cause: Secondary | ICD-10-CM

## 2016-12-08 DIAGNOSIS — L8990 Pressure ulcer of unspecified site, unspecified stage: Secondary | ICD-10-CM | POA: Insufficient documentation

## 2016-12-08 DIAGNOSIS — N390 Urinary tract infection, site not specified: Secondary | ICD-10-CM | POA: Diagnosis present

## 2016-12-08 DIAGNOSIS — Z79891 Long term (current) use of opiate analgesic: Secondary | ICD-10-CM

## 2016-12-08 DIAGNOSIS — G253 Myoclonus: Secondary | ICD-10-CM | POA: Diagnosis present

## 2016-12-08 DIAGNOSIS — Z91018 Allergy to other foods: Secondary | ICD-10-CM

## 2016-12-08 DIAGNOSIS — F1910 Other psychoactive substance abuse, uncomplicated: Secondary | ICD-10-CM | POA: Diagnosis not present

## 2016-12-08 DIAGNOSIS — R0603 Acute respiratory distress: Secondary | ICD-10-CM

## 2016-12-08 DIAGNOSIS — E872 Acidosis: Secondary | ICD-10-CM | POA: Diagnosis present

## 2016-12-08 DIAGNOSIS — E876 Hypokalemia: Secondary | ICD-10-CM | POA: Diagnosis present

## 2016-12-08 DIAGNOSIS — I10 Essential (primary) hypertension: Secondary | ICD-10-CM | POA: Diagnosis present

## 2016-12-08 DIAGNOSIS — M6282 Rhabdomyolysis: Secondary | ICD-10-CM | POA: Diagnosis present

## 2016-12-08 DIAGNOSIS — T56891A Toxic effect of other metals, accidental (unintentional), initial encounter: Secondary | ICD-10-CM | POA: Diagnosis not present

## 2016-12-08 DIAGNOSIS — T56892A Toxic effect of other metals, intentional self-harm, initial encounter: Secondary | ICD-10-CM | POA: Diagnosis not present

## 2016-12-08 DIAGNOSIS — X58XXXD Exposure to other specified factors, subsequent encounter: Secondary | ICD-10-CM | POA: Diagnosis present

## 2016-12-08 DIAGNOSIS — Z791 Long term (current) use of non-steroidal anti-inflammatories (NSAID): Secondary | ICD-10-CM | POA: Diagnosis not present

## 2016-12-08 DIAGNOSIS — F1020 Alcohol dependence, uncomplicated: Secondary | ICD-10-CM | POA: Diagnosis present

## 2016-12-08 DIAGNOSIS — F3111 Bipolar disorder, current episode manic without psychotic features, mild: Secondary | ICD-10-CM | POA: Diagnosis not present

## 2016-12-08 DIAGNOSIS — E869 Volume depletion, unspecified: Secondary | ICD-10-CM | POA: Diagnosis present

## 2016-12-08 DIAGNOSIS — R011 Cardiac murmur, unspecified: Secondary | ICD-10-CM | POA: Diagnosis present

## 2016-12-08 DIAGNOSIS — F17290 Nicotine dependence, other tobacco product, uncomplicated: Secondary | ICD-10-CM | POA: Diagnosis present

## 2016-12-08 DIAGNOSIS — S82852D Displaced trimalleolar fracture of left lower leg, subsequent encounter for closed fracture with routine healing: Secondary | ICD-10-CM | POA: Diagnosis not present

## 2016-12-08 DIAGNOSIS — E669 Obesity, unspecified: Secondary | ICD-10-CM | POA: Diagnosis present

## 2016-12-08 DIAGNOSIS — Z452 Encounter for adjustment and management of vascular access device: Secondary | ICD-10-CM | POA: Diagnosis not present

## 2016-12-08 DIAGNOSIS — R4781 Slurred speech: Secondary | ICD-10-CM | POA: Diagnosis not present

## 2016-12-08 DIAGNOSIS — Z91013 Allergy to seafood: Secondary | ICD-10-CM

## 2016-12-08 DIAGNOSIS — N529 Male erectile dysfunction, unspecified: Secondary | ICD-10-CM | POA: Diagnosis present

## 2016-12-08 DIAGNOSIS — Z79899 Other long term (current) drug therapy: Secondary | ICD-10-CM

## 2016-12-08 DIAGNOSIS — F988 Other specified behavioral and emotional disorders with onset usually occurring in childhood and adolescence: Secondary | ICD-10-CM | POA: Diagnosis present

## 2016-12-08 DIAGNOSIS — S82892A Other fracture of left lower leg, initial encounter for closed fracture: Secondary | ICD-10-CM | POA: Diagnosis not present

## 2016-12-08 DIAGNOSIS — F3160 Bipolar disorder, current episode mixed, unspecified: Secondary | ICD-10-CM | POA: Diagnosis not present

## 2016-12-08 DIAGNOSIS — J9811 Atelectasis: Secondary | ICD-10-CM | POA: Diagnosis not present

## 2016-12-08 DIAGNOSIS — Z87891 Personal history of nicotine dependence: Secondary | ICD-10-CM | POA: Diagnosis not present

## 2016-12-08 LAB — CBC WITH DIFFERENTIAL/PLATELET
Basophils Absolute: 0 10*3/uL (ref 0.0–0.1)
Basophils Relative: 0 %
EOS ABS: 0 10*3/uL (ref 0.0–0.7)
Eosinophils Relative: 0 %
HEMATOCRIT: 42.4 % (ref 39.0–52.0)
HEMOGLOBIN: 14 g/dL (ref 13.0–17.0)
LYMPHS ABS: 0.8 10*3/uL (ref 0.7–4.0)
LYMPHS PCT: 4 %
MCH: 30.4 pg (ref 26.0–34.0)
MCHC: 33 g/dL (ref 30.0–36.0)
MCV: 92 fL (ref 78.0–100.0)
MONOS PCT: 7 %
Monocytes Absolute: 1.4 10*3/uL — ABNORMAL HIGH (ref 0.1–1.0)
NEUTROS ABS: 17.2 10*3/uL — AB (ref 1.7–7.7)
NEUTROS PCT: 89 %
Platelets: 178 10*3/uL (ref 150–400)
RBC: 4.61 MIL/uL (ref 4.22–5.81)
RDW: 14 % (ref 11.5–15.5)
WBC: 19.4 10*3/uL — AB (ref 4.0–10.5)

## 2016-12-08 LAB — CBG MONITORING, ED
GLUCOSE-CAPILLARY: 175 mg/dL — AB (ref 65–99)
Glucose-Capillary: 49 mg/dL — ABNORMAL LOW (ref 65–99)
Glucose-Capillary: <10 mg/dL — CL (ref 65–99)

## 2016-12-08 LAB — I-STAT TROPONIN, ED: TROPONIN I, POC: 0.01 ng/mL (ref 0.00–0.08)

## 2016-12-08 LAB — COMPREHENSIVE METABOLIC PANEL
ALK PHOS: 64 U/L (ref 38–126)
ALT: 13 U/L — AB (ref 17–63)
AST: 14 U/L — AB (ref 15–41)
Albumin: 4.1 g/dL (ref 3.5–5.0)
Anion gap: 13 (ref 5–15)
BUN: 54 mg/dL — AB (ref 6–20)
CALCIUM: 9 mg/dL (ref 8.9–10.3)
CHLORIDE: 104 mmol/L (ref 101–111)
CO2: 20 mmol/L — AB (ref 22–32)
CREATININE: 3.87 mg/dL — AB (ref 0.61–1.24)
GFR calc Af Amer: 18 mL/min — ABNORMAL LOW (ref >60)
GFR, EST NON AFRICAN AMERICAN: 16 mL/min — AB (ref >60)
Glucose, Bld: 85 mg/dL (ref 65–99)
Potassium: 3.9 mmol/L (ref 3.5–5.1)
Sodium: 137 mmol/L (ref 135–145)
Total Bilirubin: 1.4 mg/dL — ABNORMAL HIGH (ref 0.3–1.2)
Total Protein: 6.3 g/dL — ABNORMAL LOW (ref 6.5–8.1)

## 2016-12-08 LAB — ETHANOL

## 2016-12-08 LAB — ACETAMINOPHEN LEVEL: Acetaminophen (Tylenol), Serum: <10 ug/mL — ABNORMAL LOW (ref 10–30)

## 2016-12-08 LAB — SALICYLATE LEVEL: Salicylate Lvl: <7 mg/dL (ref 2.8–30.0)

## 2016-12-08 LAB — LITHIUM LEVEL: Lithium Lvl: 2.29 mmol/L (ref 0.60–1.20)

## 2016-12-08 MED ORDER — SODIUM CHLORIDE 0.9 % IV BOLUS (SEPSIS)
1000.0000 mL | Freq: Once | INTRAVENOUS | Status: AC
Start: 2016-12-08 — End: 2016-12-09
  Administered 2016-12-08: 1000 mL via INTRAVENOUS

## 2016-12-08 NOTE — ED Provider Notes (Signed)
[facility] DEPT Provider Note   CSN: [id number] Arrival date & time: 12/08/16  2119     History   Chief Complaint Chief Complaint  Patient presents with  . Altered Mental Status    HPI Colin Kelley is a 56 y.o. male.  The history is provided by the EMS personnel.     Level V caveat: Altered mental status 56 year old male with history of alcohol use disorder, ADD, cocaine use disorder, dyslipidemia, hypertension, sleep apnea, substance-induced mood disorder, presenting to the ED with altered mental status. Father reportedly called EMS as patient was "sleeping too much". States he has been in bed for about 24 hours. They're concerned for potential overdose, apparently some of his home medications were changed about 2 weeks ago (unsure which ones).  Patient unable to provide any further reliable history here.  Past Medical History:  Diagnosis Date  . Alcohol use disorder, severe, in controlled environment ([place])   . Allergic rhinitis   . Attention deficit disorder without hyperactivity   . Cocaine use disorder, severe, dependence ([place])   . 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 Dyslipidemia, goal LDL below 130   . Hypertension   . Hypogonadism male   . OSA (obstructive sleep apnea)   . Spinal stenosis of lumbar region without neurogenic claudication   . Spondylolisthesis   . Substance induced mood disorder ([place])     There are no active problems to display for this patient.   Past Surgical History:  Procedure Laterality Date  . prostectomy    . SHOULDER SURGERY         Home Medications    Prior to Admission medications   Medication Sig Start Date End Date Taking? Authorizing Provider  albuterol (PROAIR HFA) 108 (90 Base) MCG/ACT inhaler Inhale 2 puffs into the lungs every 6 (six) hours as needed. Shortness of breath 05/29/11   [provider]  azelastine (ASTELIN) 0.1 % nasal spray Place 2 sprays into both nostrils daily. 07/11/15    [provider]  fluticasone furoate-vilanterol (BREO ELLIPTA) 100-25 MCG/INH AEPB Inhale 1 puff into the lungs daily. 07/11/15   [provider]  gabapentin (NEURONTIN) 800 MG tablet Take 800 mg by mouth 2 (two) times daily. 08/08/15   [provider]  HYDROcodone-acetaminophen (NORCO/VICODIN) 5-325 MG tablet Take 1 tablet by mouth every 4 (four) hours as needed. 09/13/15   Dorie Rank, MD  lisinopril (PRINIVIL,ZESTRIL) 40 MG tablet Take 40 mg by mouth daily. 09/05/15 09/04/16  [provider]  lithium carbonate 300 MG capsule Take 300-600 mg by mouth daily. Take 300 mg every morning Take 600 mg at bedtime 07/03/15   [provider]  Phosphatidylserine-DHA-EPA (VAYACOG) 100-19.5-6.5 MG CAPS Take 1 capsule by mouth daily. 08/10/15   [provider]  QUEtiapine (SEROQUEL) 50 MG tablet Take 50 mg by mouth at bedtime. 08/25/15   [provider]    Family History No family history on file.  Social History Social History  Substance Use Topics  . Smoking status: Former Smoker    Quit date: 09/13/2014  . Smokeless tobacco: Current User    Types: Chew  . Alcohol use No     Allergies   Shellfish-derived products; Fructose; and Sulfa antibiotics   Review of Systems Review of Systems  Unable to perform ROS: Other     Physical Exam Updated Vital Signs BP 117/64 (BP Location: Right Arm)   Pulse 95   Temp 98.7 F (37.1 C) (Oral)  Resp (!) 25   SpO2 100%   Physical Exam  Constitutional: He appears well-developed and well-nourished.  HENT:  Head: Normocephalic and atraumatic.  Mouth/Throat: Oropharynx is clear and moist.  Eyes: Pupils are equal, round, and reactive to light. Conjunctivae and EOM are normal.  Neck: Normal range of motion.  Cardiovascular: Normal rate, regular rhythm and normal heart sounds.   Pulmonary/Chest: Effort normal and breath sounds normal. No respiratory distress. He has no wheezes.  Abdominal: Soft.  Bowel sounds are normal.  Musculoskeletal: Normal range of motion.  Neurological:  Able to answer some questions and follow some commands; speech is somewhat slurred, intermittently intelligible; fidgeting continuously during exam  Skin: Skin is warm and dry.  Psychiatric: He has a normal mood and affect.  Nursing note and vitals reviewed.    ED Treatments / Results  Labs (all labs ordered are listed, but only abnormal results are displayed) Labs Reviewed  CBC WITH DIFFERENTIAL/PLATELET - Abnormal; Notable for the following:       Result Value   WBC 19.4 (*)    Neutro Abs 17.2 (*)    Monocytes Absolute 1.4 (*)    All other components within normal limits  COMPREHENSIVE METABOLIC PANEL - Abnormal; Notable for the following:    CO2 20 (*)    BUN 54 (*)    Creatinine, Ser 3.87 (*)    Total Protein 6.3 (*)    AST 14 (*)    ALT 13 (*)    Total Bilirubin 1.4 (*)    GFR calc non Af Amer 16 (*)    GFR calc Af Amer 18 (*)    All other components within normal limits  LITHIUM LEVEL - Abnormal; Notable for the following:    Lithium Lvl 2.29 (*)    All other components within normal limits  ACETAMINOPHEN LEVEL - Abnormal; Notable for the following:    Acetaminophen (Tylenol), Serum <10 (*)    All other components within normal limits  CBG MONITORING, ED - Abnormal; Notable for the following:    Glucose-Capillary <10 (*)    All other components within normal limits  CBG MONITORING, ED - Abnormal; Notable for the following:    Glucose-Capillary 49 (*)    All other components within normal limits  CBG MONITORING, ED - Abnormal; Notable for the following:    Glucose-Capillary 175 (*)    All other components within normal limits  ETHANOL  SALICYLATE LEVEL  RAPID URINE DRUG SCREEN, HOSP PERFORMED  URINALYSIS, ROUTINE W REFLEX MICROSCOPIC  CK  I-STAT TROPONIN, ED    EKG  EKG Interpretation  Date/Time:  Sunday December 08 2016 21:27:01 EDT Ventricular Rate:  95 PR Interval:      QRS Duration: 102 QT Interval:  367 QTC Calculation: 462 R Axis:   23 Text Interpretation:  Sinus rhythm No STEMI.  Confirmed by Nanda Quinton [id number]) on 12/08/2016 9:30:29 PM       Radiology Ct Head Wo Contrast  Result Date: 12/08/2016 CLINICAL DATA:  Possible overdose.  Slurred speech. EXAM: CT HEAD WITHOUT CONTRAST TECHNIQUE: Contiguous axial images were obtained from the base of the skull through the vertex without intravenous contrast. COMPARISON:  None. FINDINGS: Brain: No evidence of acute infarction, hemorrhage, hydrocephalus, extra-axial collection or mass lesion/mass effect. Vascular: No hyperdense vessel or unexpected calcification. Skull: Normal. Negative for fracture or focal lesion. Sinuses/Orbits: Mild polypoid mucosal thickening of the maxillary sinuses. Postsurgical changes in the maxillary sinuses. Other: None. IMPRESSION: No acute intracranial abnormality. Electronically Signed  By: Fidela Salisbury M.D.   On: 12/08/2016 22:27   Dg Chest Port 1 View  Result Date: 12/08/2016 CLINICAL DATA:  Altered mental status. EXAM: PORTABLE CHEST 1 VIEW COMPARISON:  Frontal and lateral views 04/21/2008 FINDINGS: Very low lung volumes limit assessment. Probable bibasilar atelectasis. Heart appears prominent in size, likely accentuated by technique. No confluent airspace disease. No large pleural effusion or pneumothorax. Osseous structures are grossly intact. IMPRESSION: Low lung volumes limiting assessment. Bibasilar atelectasis with prominent heart size likely accentuated by portable technique and low lung volumes. Recommend follow-up PA and lateral views based on clinical concern. Electronically Signed   By: Jeb Levering M.D.   On: 12/08/2016 23:10    Procedures Procedures (including critical care time)  CRITICAL CARE Performed by: Larene Pickett   Total critical care time: 45 minutes  Critical care time was exclusive of separately billable procedures and treating other  patients.  Critical care was necessary to treat or prevent imminent or life-threatening deterioration.  Critical care was time spent personally by me on the following activities: development of treatment plan with patient and/or surrogate as well as nursing, discussions with consultants, evaluation of patient's response to treatment, examination of patient, obtaining history from patient or surrogate, ordering and performing treatments and interventions, ordering and review of laboratory studies, ordering and review of radiographic studies, pulse oximetry and re-evaluation of patient's condition.   Medications Ordered in ED Medications - No data to display   Initial Impression / Assessment and Plan / ED Course  I have reviewed the triage vital signs and the nursing notes.  Pertinent labs & imaging results that were available during my care of the patient were reviewed by me and considered in my medical decision making (see chart for details).  56 year old male here with altered mental status. Family at home reported medication changes about 2 weeks ago, unsure which. Here on exam patient is awake but does appear somewhat somnolent. He is able to answer some questions and follow limited commands. Speech is slurred, intermittently intelligible. He is fidgeting continuously during exam. Differential broad at this point. He is on lithium. We'll plan for basic labs, lithium level, CT head, chest x-ray, drug screens.  Lithium level elevated here at 2.29.  Patient also in new renal failure.  Will require admission.  IVF started.  Pharmacy tech has called and spoken with patient's father, he does confirm that some medications were changed, he has the bottles with him and will bring tomorrow. She has called patient's pharmacy, it does appear new prescription for lithium was filled 6 days ago for increased dose of 450 mg, it appears patient also filled prescription for his old 300mg  dose as well.  Question  if patient has been inadvertently taking both?  Discussed with triad hospitalist, Dr. Lorin Mercy-- will admit for further management.  Requests nephrology consult to see if patient should be dialyzed.    11:34 PM Discussed with nephrology, Dr. Hollie Salk-- leaning towards dialysis given his new ARF and elevated lithium level.  She will evaluate in the ED and decide.  Nephrology has evaluated-- Patient will undergo emergent dialysis.  This can likely be temporary but will need to monitor closely.  CK was added to work-up and is pending at this time.  Final Clinical Impressions(s) / ED Diagnoses   Final diagnoses:  Lithium toxicity, undetermined intent, initial encounter  Acute renal failure, unspecified acute renal failure type ([place])  Altered mental status, unspecified altered mental status type  New Prescriptions New Prescriptions   No medications on file     Kathryne Hitch 12/09/16 0003    Margette Fast, MD 12/09/16 [phone]

## 2016-12-08 NOTE — H&P (Signed)
History and Physical    Colin Kelley:[id number] DOB: [birth year] DOA: 12/08/2016  PCP: Ann Held, MD Patient coming from:  Home - lives with parents; NOK: father  Chief Complaint: AMS  HPI: Colin Kelley is a 56 y.o. male with medical history significant of polysubstance abuse; OSA; HTN; HLD; and bipolar disorder presenting with AMS.     HPI per PA Sanders:  56 year old male with history of alcohol use disorder, ADD, cocaine use disorder, dyslipidemia, hypertension, sleep apnea, substance-induced mood disorder, presenting to the ED with altered mental status. Father reportedly called EMS as patient was "sleeping too much". States he has been in bed for about 24 hours. They're concerned for potential overdose, apparently some of his home medications were changed about 2 weeks ago (unsure which ones). Patient unable to provide any further reliable history here.  I called and spoke to his father, who reports that he slept through the night and through the day.  He was "wobbling like he was drunk last week.  Unsteady on his feet.  He would drop things and make a mess" in the kitchen.  His father is planning to bring in his medications in the AM, but he is unsure when his medications were changed.  He came to live with his parents about 2 years ago after he was kicked out of his group home for using drugs; they do not think he has continued to use drugs since moving in with them.  He has had a malunion ankle fracture, which causes him chronic pain.    ED Course:  Patient somnolent, follows limited commands, speech is slurred and intermittently unintelligible.  Patient fidgety.  Lithium level elevated at 2.29 and with new renal failure.  Pharmacy tech reports recent medications changes, with a new rx for lithium at 450 mg dose - he previously took 300 mg qAM and 600 mg qhs.  Nephrology consulted and will do emergent HD.  Review of Systems: Unable to review  Past Medical History:  Diagnosis  Date  . Alcohol use disorder, severe, in controlled environment ([place])   . Allergic rhinitis   . Attention deficit disorder without hyperactivity   . Cocaine use disorder, severe, dependence ([place])   . 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 Dyslipidemia, goal LDL below 130   . Hypertension   . Hypogonadism male   . OSA (obstructive sleep apnea)   . Spinal stenosis of lumbar region without neurogenic claudication   . Spondylolisthesis   . Substance induced mood disorder [facility])     Past Surgical History:  Procedure Laterality Date  . prostectomy    . SHOULDER SURGERY      Social History   Social History  . Marital status: Single    Spouse name: N/A  . Number of children: N/A  . Years of education: N/A   Occupational History  . Not on file.   Social History Main Topics  . Smoking status: Former Smoker    Quit date: 09/13/2014  . Smokeless tobacco: Current User    Types: Chew  . Alcohol use No  . Drug use: No  . Sexual activity: Not on file   Other Topics Concern  . Not on file   Social History Narrative  . No narrative on file    Allergies  Allergen Reactions  . Shellfish-Derived Products Hives  . Fructose Nausea And Vomiting  . Sulfa Antibiotics Rash    No family history on file.  Prior  to Admission medications   Medication Sig Start Date End Date Taking? Authorizing Provider  albuterol (PROAIR HFA) 108 (90 Base) MCG/ACT inhaler Inhale 2 puffs into the lungs every 6 (six) hours as needed. Shortness of breath 05/29/11   [provider]  atorvastatin (LIPITOR) 20 MG tablet Take 20 mg by mouth daily. 12/01/16   [provider]  azelastine (ASTELIN) 0.1 % nasal spray Place 2 sprays into both nostrils daily. 07/11/15   [provider]  azelastine (OPTIVAR) 0.05 % ophthalmic solution Place into both eyes. 11/17/16   [provider]  fluticasone furoate-vilanterol (BREO ELLIPTA) 100-25 MCG/INH AEPB Inhale 1 puff into  the lungs daily. 07/11/15   [provider]  gabapentin (NEURONTIN) 800 MG tablet Take 800 mg by mouth 2 (two) times daily. 08/08/15   [provider]  HYDROcodone-acetaminophen (NORCO/VICODIN) 5-325 MG tablet Take 1 tablet by mouth every 4 (four) hours as needed. 09/13/15   Dorie Rank, MD  lamoTRIgine (LAMICTAL) 200 MG tablet Take 200 mg by mouth daily. 11/30/16   [provider]  lisinopril (PRINIVIL,ZESTRIL) 40 MG tablet Take 40 mg by mouth daily. 09/05/15 09/04/16  [provider]  lithium carbonate (ESKALITH) 450 MG CR tablet Take 450 mg by mouth. 11/27/16   [provider]  lithium carbonate (LITHOBID) 300 MG CR tablet Take 300 mg by mouth. 11/21/16   [provider]  meloxicam (MOBIC) 7.5 MG tablet Take 7.5 mg by mouth. 10/22/16   [provider]  Phosphatidylserine-DHA-EPA (VAYACOG) 100-19.5-6.5 MG CAPS Take 1 capsule by mouth daily. 08/10/15   [provider]  polyethylene glycol powder (GLYCOLAX/MIRALAX) powder Take 17 g by mouth. 10/20/16   [provider]  propranolol ER (INDERAL LA) 60 MG 24 hr capsule Take 60 mg by mouth daily. 12/06/16   [provider]  QUEtiapine (SEROQUEL XR) 300 MG 24 hr tablet Take 300 mg by mouth daily. 11/30/16   [provider]  tadalafil (CIALIS) 20 MG tablet Take 20 mg by mouth daily as needed for erectile dysfunction.    [provider]  traZODone (DESYREL) 50 MG tablet Take 50 mg by mouth. 12/06/16   [provider]  zolpidem (AMBIEN) 10 MG tablet Take 10 mg by mouth. 11/27/16   [provider]    Physical Exam: Vitals:   12/08/16 2315 12/08/16 2330 12/09/16 0000 12/09/16 0015  BP: (!) 102/49 110/80 113/60 120/65  Pulse: 94 95 99 98  Resp: (!) 28 (!) 25  19  Temp:      TempSrc:      SpO2: 93% 95% 99% 95%     General: Appears altered - somnolent, slurred speech, slow responses, inability to answer many questions appropriately Eyes: Marked  conjunctival injection, PERRLA, EOMI ENT:  grossly normal hearing; lips & tongue very dry Neck:  no LAD, masses or thyromegaly; no carotid bruits Cardiovascular:  RRR, no m/r/g. No LE edema.  Respiratory:   CTA bilaterally with no wheezes/rales/rhonchi.  Normal respiratory effort. Abdomen:  soft, NT, ND, NABS Skin:  no rash or induration seen on limited exam Musculoskeletal:  grossly normal tone BUE/BLE, good ROM, no bony abnormality Psychiatric:flat mood and affect, somnolent, speech slurred and slow, AOx2 Neurologic: tremors and myoclonic jerks noted    Radiological Exams on Admission: Ct Head Wo Contrast  Result Date: 12/08/2016 CLINICAL DATA:  Possible overdose.  Slurred speech. EXAM: CT HEAD WITHOUT CONTRAST TECHNIQUE: Contiguous axial images were obtained from the base of the skull through the vertex without  intravenous contrast. COMPARISON:  None. FINDINGS: Brain: No evidence of acute infarction, hemorrhage, hydrocephalus, extra-axial collection or mass lesion/mass effect. Vascular: No hyperdense vessel or unexpected calcification. Skull: Normal. Negative for fracture or focal lesion. Sinuses/Orbits: Mild polypoid mucosal thickening of the maxillary sinuses. Postsurgical changes in the maxillary sinuses. Other: None. IMPRESSION: No acute intracranial abnormality. Electronically Signed   By: Fidela Salisbury M.D.   On: 12/08/2016 22:27   Dg Chest Port 1 View  Result Date: 12/08/2016 CLINICAL DATA:  Altered mental status. EXAM: PORTABLE CHEST 1 VIEW COMPARISON:  Frontal and lateral views 04/21/2008 FINDINGS: Very low lung volumes limit assessment. Probable bibasilar atelectasis. Heart appears prominent in size, likely accentuated by technique. No confluent airspace disease. No large pleural effusion or pneumothorax. Osseous structures are grossly intact. IMPRESSION: Low lung volumes limiting assessment. Bibasilar atelectasis with prominent heart size likely accentuated by portable  technique and low lung volumes. Recommend follow-up PA and lateral views based on clinical concern. Electronically Signed   By: Jeb Levering M.D.   On: 12/08/2016 23:10    EKG: Independently reviewed.  NSR with rate 92; no evidence of acute ischemia   Labs on Admission: I have personally reviewed the available labs and imaging studies at the time of the admission.  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 changes to include sluggishness, ataxia, confusion, tremors, and myoclonic jerks -Currently without apparent seizure -Will admit to SDU -Seizure precautions -Nephrology was consulted and given his neurologic symptoms in the face of supratherapeutic lithium level it is reasonable to proceed with emergent dialysis -I called and spoke with the patient's father, who agreed to CVL placement and hemodialysis -While his level is technically not high enough to qualify as severe lithium toxicity, he will still need to be monitored for SILENT despite dialysis (Syndrome of Irreversible Lithium Effectuated Neurotoxicity) -IV hydration for now -NPO for now -Hold all PO meds - and keep in mind that Lisinopril and Mobic may have made lithium toxicity more likely and so may need to hold these medications in the future if lithium is to be continued. -Current lithium toxicity is thought to be related to recent dosing adjustments and misunderstanding about medication  administration, so we will need to be aware of medical literacy with this patient/family going forward. -CK added on by Dr. Hollie Salk given the report of the patient sleeping for 24+ hours; if present, rhabdomyolysis further increases the likelihood of need for HD. -ABG as per Dr. Hollie Salk given mild anion gap metabolic acidosis. -Consider psych consult once medically stable.   DVT prophylaxis: Lovenox  Code Status: Full  Family Communication: Brief telephonic conversation with patient's father Disposition Plan:  Home once clinically improved Consults called: Nephrology  Admission status: Admit - It is my clinical opinion that admission to INPATIENT is reasonable and necessary because this patient will require at least 2 midnights in the hospital to treat this condition based on the medical complexity of the problems presented.  Given the aforementioned information, the predictability of an  adverse outcome is felt to be significant.  Total critical care time: 55 minutes Critical care time was exclusive of separately billable procedures and treating other patients. Critical care was necessary to treat or prevent imminent or life-threatening deterioration. Critical care was time spent personally by me on the following activities: development of treatment plan with patient and/or surrogate as well as nursing, discussions with consultants, evaluation of patient's response to treatment, examination of patient, obtaining history from patient or surrogate, ordering and performing treatments and interventions, ordering and review of laboratory studies, ordering and review of radiographic studies, pulse oximetry and re-evaluation of patient's condition.   Karmen Bongo MD [facility]  If note is complete, please contact covering daytime or nighttime physician. [email or web address] Password [email or web address]  12/09/2016, 12:26 AM

## 2016-12-08 NOTE — Consult Note (Addendum)
[facility] Consult Note     Date: 12/08/2016                  Patient Name:  Colin Kelley  MRN: [id number]  DOB: Jan 03, 1961  Age / Sex: 56 y.o., male         PCP: Susie Cassette Larina Earthly, MD                 Service Requesting Consult: ED and [facility]- Dr. Lorin Mercy                 Reason for Consult: Lithium toxicity            Chief Complaint: weakness and lethargy  HPI: Pt is a 6M with a PMH significant for cocaine and EtOH abuse, ADD, mood disorder on lithium, who is now seen in consultation at the request of Dr. Lorin Mercy for evaluation and recommendations surrounding lithium toxicity.  History is obtained from chart review as pt is not able to provide history.    Pt was brought to the ED by EMS with acute encephalopathy.  Father called EMS; apparently pt had been sleeping for ~ 24 hours.  Of note, pt's lithium had been increased to 450 mg from 300 mg; both the 450 mg and the 300 mg lithium tablets were filled 6 days ago.   Pt's lithium level was 2.29; Cr of 3.87 which is new.  He reports thirst.  No reports of seizure activity.  He can't converse in complete sentences and has some myoclonic jerking.    Past Medical History:  Diagnosis Date  . Alcohol use disorder, severe, in controlled environment ([place])   . Allergic rhinitis   . Attention deficit disorder without hyperactivity   . Cocaine use disorder, severe, dependence ([place])   . 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 Dyslipidemia, goal LDL below 130   . Hypertension   . Hypogonadism male   . OSA (obstructive sleep apnea)   . Spinal stenosis of lumbar region without neurogenic claudication   . Spondylolisthesis   . Substance induced mood disorder [facility])     Past Surgical History:  Procedure Laterality Date  . prostectomy    . SHOULDER SURGERY      No family history on file. Social History:  reports that he quit smoking about 2 years ago. His smokeless tobacco use includes Chew. He reports that  he does not drink alcohol or use drugs.  Allergies:  Allergies  Allergen Reactions  . Shellfish-Derived Products Hives  . Fructose Nausea And Vomiting  . Sulfa Antibiotics Rash     (Not in a hospital admission)  Results for orders placed or performed during the hospital encounter of 12/08/16 (from the past 48 hour(s))  CBC with Differential     Status: Abnormal   Collection Time: 12/08/16  9:32 PM  Result Value Ref Range   WBC 19.4 (H) 4.0 - 10.5 K/uL   RBC 4.61 4.22 - 5.81 MIL/uL   Hemoglobin 14.0 13.0 - 17.0 g/dL   HCT 42.4 39.0 - 52.0 %   MCV 92.0 78.0 - 100.0 fL   MCH 30.4 26.0 - 34.0 pg   MCHC 33.0 30.0 - 36.0 g/dL   RDW 14.0 11.5 - 15.5 %   Platelets 178 150 - 400 K/uL   Neutrophils Relative % 89 %   Neutro Abs 17.2 (H) 1.7 - 7.7 K/uL   Lymphocytes Relative 4 %   Lymphs Abs 0.8 0.7 - 4.0  K/uL   Monocytes Relative 7 %   Monocytes Absolute 1.4 (H) 0.1 - 1.0 K/uL   Eosinophils Relative 0 %   Eosinophils Absolute 0.0 0.0 - 0.7 K/uL   Basophils Relative 0 %   Basophils Absolute 0.0 0.0 - 0.1 K/uL  Ethanol     Status: None   Collection Time: 12/08/16  9:32 PM  Result Value Ref Range   Alcohol, Ethyl (B) <5 <5 mg/dL    Comment:        LOWEST DETECTABLE LIMIT FOR SERUM ALCOHOL IS 5 mg/dL FOR MEDICAL PURPOSES ONLY   Comprehensive metabolic panel     Status: Abnormal   Collection Time: 12/08/16  9:32 PM  Result Value Ref Range   Sodium 137 135 - 145 mmol/L   Potassium 3.9 3.5 - 5.1 mmol/L   Chloride 104 101 - 111 mmol/L   CO2 20 (L) 22 - 32 mmol/L   Glucose, Bld 85 65 - 99 mg/dL   BUN 54 (H) 6 - 20 mg/dL   Creatinine, Ser 3.87 (H) 0.61 - 1.24 mg/dL   Calcium 9.0 8.9 - 10.3 mg/dL   Total Protein 6.3 (L) 6.5 - 8.1 g/dL   Albumin 4.1 3.5 - 5.0 g/dL   AST 14 (L) 15 - 41 U/L   ALT 13 (L) 17 - 63 U/L   Alkaline Phosphatase 64 38 - 126 U/L   Total Bilirubin 1.4 (H) 0.3 - 1.2 mg/dL   GFR calc non Af Amer 16 (L) >60 mL/min   GFR calc Af Amer 18 (L) >60 mL/min     Comment: (NOTE) The eGFR has been calculated using the CKD EPI equation. This calculation has not been validated in all clinical situations. eGFR's persistently <60 mL/min signify possible Chronic Kidney Disease.    Anion gap 13 5 - 15  Lithium level     Status: Abnormal   Collection Time: 12/08/16  9:32 PM  Result Value Ref Range   Lithium Lvl 2.29 (HH) 0.60 - 1.20 mmol/L    Comment: CRITICAL RESULT CALLED TO, READ BACK BY AND VERIFIED WITH: WAGNER N,RN 12/08/16 2224 WAYK   Acetaminophen level     Status: Abnormal   Collection Time: 12/08/16  9:32 PM  Result Value Ref Range   Acetaminophen (Tylenol), Serum <10 (L) 10 - 30 ug/mL    Comment:        THERAPEUTIC CONCENTRATIONS VARY SIGNIFICANTLY. A RANGE OF 10-30 ug/mL MAY BE AN EFFECTIVE CONCENTRATION FOR MANY PATIENTS. HOWEVER, SOME ARE BEST TREATED AT CONCENTRATIONS OUTSIDE THIS RANGE. ACETAMINOPHEN CONCENTRATIONS >150 ug/mL AT 4 HOURS AFTER INGESTION AND >50 ug/mL AT 12 HOURS AFTER INGESTION ARE OFTEN ASSOCIATED WITH TOXIC REACTIONS.   Salicylate level     Status: None   Collection Time: 12/08/16  9:32 PM  Result Value Ref Range   Salicylate Lvl <1.2 2.8 - 30.0 mg/dL  POC CBG, ED     Status: Abnormal   Collection Time: 12/08/16  9:40 PM  Result Value Ref Range   Glucose-Capillary <10 (LL) 65 - 99 mg/dL  CBG monitoring, ED     Status: Abnormal   Collection Time: 12/08/16  9:42 PM  Result Value Ref Range   Glucose-Capillary 49 (L) 65 - 99 mg/dL  I-stat troponin, ED     Status: None   Collection Time: 12/08/16  9:51 PM  Result Value Ref Range   Troponin i, poc 0.01 0.00 - 0.08 ng/mL   Comment 3  Comment: Due to the release kinetics of cTnI, a negative result within the first hours of the onset of symptoms does not rule out myocardial infarction with certainty. If myocardial infarction is still suspected, repeat the test at appropriate intervals.   CBG monitoring, ED     Status: Abnormal   Collection  Time: 12/08/16 10:35 PM  Result Value Ref Range   Glucose-Capillary 175 (H) 65 - 99 mg/dL   Ct Head Wo Contrast  Result Date: 12/08/2016 CLINICAL DATA:  Possible overdose.  Slurred speech. EXAM: CT HEAD WITHOUT CONTRAST TECHNIQUE: Contiguous axial images were obtained from the base of the skull through the vertex without intravenous contrast. COMPARISON:  None. FINDINGS: Brain: No evidence of acute infarction, hemorrhage, hydrocephalus, extra-axial collection or mass lesion/mass effect. Vascular: No hyperdense vessel or unexpected calcification. Skull: Normal. Negative for fracture or focal lesion. Sinuses/Orbits: Mild polypoid mucosal thickening of the maxillary sinuses. Postsurgical changes in the maxillary sinuses. Other: None. IMPRESSION: No acute intracranial abnormality. Electronically Signed   By: Fidela Salisbury M.D.   On: 12/08/2016 22:27   Dg Chest Port 1 View  Result Date: 12/08/2016 CLINICAL DATA:  Altered mental status. EXAM: PORTABLE CHEST 1 VIEW COMPARISON:  Frontal and lateral views 04/21/2008 FINDINGS: Very low lung volumes limit assessment. Probable bibasilar atelectasis. Heart appears prominent in size, likely accentuated by technique. No confluent airspace disease. No large pleural effusion or pneumothorax. Osseous structures are grossly intact. IMPRESSION: Low lung volumes limiting assessment. Bibasilar atelectasis with prominent heart size likely accentuated by portable technique and low lung volumes. Recommend follow-up PA and lateral views based on clinical concern. Electronically Signed   By: Jeb Levering M.D.   On: 12/08/2016 23:10    ROS: all other systems reviewed and are negative except as per HPI  Blood pressure 110/80, pulse 95, temperature 98.7 F (37.1 C), temperature source Oral, resp. rate (!) 25, SpO2 95 %. Physical Exam  GEN sitting in bed, appears uncomfortable HEENT EOMI, pupils 2-3 mm and minimally reactive, no scleral icterus, dry MM NECK no  JVD PULM clear bilaterally no c/w/r CV tachycardic no m/r/g ABD soft, nontender, hypoactive bowel sounds EXT no LE edema NEURO + myoclonic jerking SKIN no rashes or lesions  Assessment/Plan  1.  Acute lithium toxicity: level of 2.29 which in and of itself is not an absolute indication for dialysis but combined with the AKI and clinical signs of toxicity including lethargy and myoclonic jerking I believe it is most appropriate to dialyze.  Will contact PCCM to place HD cath and dialyze tonight.  I suspect he will need multiple sessions of dialysis to clear lithium due to rebound potential.  It is unclear if this was an intentional overdose.  Probably we need a psych consult.  Renal US ordered too, rec lithium levels q 12  2.  Acute kidney injury: Creatinine in May was 0.93 according to [place].  Now is 3.87.  Pt has dry MM and is volume down, believe some of this is due to volume depletion but could also be AKI from lithium itself.  Agree with IVFs for now.  Adding on CK.    3: Mild anion gap metabolic acidosis: His AG is 13 and he has a very mild metabolic acidosis.  Salicylate and tylenol levels are negative.  I have a low suspicion for a co-ingestion but would recommend an ABG to detect the presence of a triple acid-base disorder.  4.  Mood disorder/substance abuse: psych c/s as above in #1.  Madelon Lips, MD [facility] pgr [phone] 12/08/2016, 11:51 PM

## 2016-12-08 NOTE — ED Notes (Signed)
Glucometer malfunction disregard blood sugar of less than 10. Pt speaking and per lisa PA pt able drink OJ and cranberry juice

## 2016-12-08 NOTE — ED Triage Notes (Signed)
Per ems pt from home family called out for possible overdose, pt had been in bed for 24 hrs, last seen normal 6pm last night, speech slurred and follows some commands, pt repeats answers, unsteady on feet, family thinks it may be a medication interaction (medications changed 2 weeks ago). Pinpoint pupils, no unilateral weakness

## 2016-12-09 ENCOUNTER — Encounter (HOSPITAL_COMMUNITY): Payer: Self-pay

## 2016-12-09 ENCOUNTER — Inpatient Hospital Stay (HOSPITAL_COMMUNITY): Payer: Medicare Other

## 2016-12-09 DIAGNOSIS — G9341 Metabolic encephalopathy: Secondary | ICD-10-CM | POA: Diagnosis present

## 2016-12-09 DIAGNOSIS — F319 Bipolar disorder, unspecified: Secondary | ICD-10-CM

## 2016-12-09 DIAGNOSIS — T56894A Toxic effect of other metals, undetermined, initial encounter: Secondary | ICD-10-CM

## 2016-12-09 DIAGNOSIS — N179 Acute kidney failure, unspecified: Secondary | ICD-10-CM | POA: Diagnosis present

## 2016-12-09 LAB — CBC
HCT: 40.3 % (ref 39.0–52.0)
HCT: 40.8 % (ref 39.0–52.0)
Hemoglobin: 13.3 g/dL (ref 13.0–17.0)
Hemoglobin: 13.6 g/dL (ref 13.0–17.0)
MCH: 30.2 pg (ref 26.0–34.0)
MCH: 30.4 pg (ref 26.0–34.0)
MCHC: 33 g/dL (ref 30.0–36.0)
MCHC: 33.3 g/dL (ref 30.0–36.0)
MCV: 91.3 fL (ref 78.0–100.0)
MCV: 91.6 fL (ref 78.0–100.0)
PLATELETS: 166 10*3/uL (ref 150–400)
PLATELETS: 175 10*3/uL (ref 150–400)
RBC: 4.4 MIL/uL (ref 4.22–5.81)
RBC: 4.47 MIL/uL (ref 4.22–5.81)
RDW: 13.9 % (ref 11.5–15.5)
RDW: 13.9 % (ref 11.5–15.5)
WBC: 14.7 10*3/uL — AB (ref 4.0–10.5)
WBC: 16.7 10*3/uL — AB (ref 4.0–10.5)

## 2016-12-09 LAB — BASIC METABOLIC PANEL
Anion gap: 9 (ref 5–15)
BUN: 48 mg/dL — ABNORMAL HIGH (ref 6–20)
CALCIUM: 9 mg/dL (ref 8.9–10.3)
CO2: 21 mmol/L — ABNORMAL LOW (ref 22–32)
CREATININE: 2.61 mg/dL — AB (ref 0.61–1.24)
Chloride: 108 mmol/L (ref 101–111)
GFR, EST AFRICAN AMERICAN: 30 mL/min — AB (ref >60)
GFR, EST NON AFRICAN AMERICAN: 26 mL/min — AB (ref >60)
Glucose, Bld: 116 mg/dL — ABNORMAL HIGH (ref 65–99)
Potassium: 3.8 mmol/L (ref 3.5–5.1)
SODIUM: 138 mmol/L (ref 135–145)

## 2016-12-09 LAB — URINALYSIS, ROUTINE W REFLEX MICROSCOPIC
BILIRUBIN URINE: NEGATIVE
Glucose, UA: NEGATIVE mg/dL
KETONES UR: 5 mg/dL — AB
NITRITE: POSITIVE — AB
PH: 5 (ref 5.0–8.0)
PROTEIN: 100 mg/dL — AB
SPECIFIC GRAVITY, URINE: 1.016 (ref 1.005–1.030)

## 2016-12-09 LAB — RAPID URINE DRUG SCREEN, HOSP PERFORMED
AMPHETAMINES: NOT DETECTED
BARBITURATES: NOT DETECTED
Benzodiazepines: NOT DETECTED
Cocaine: NOT DETECTED
Opiates: NOT DETECTED
TETRAHYDROCANNABINOL: NOT DETECTED

## 2016-12-09 LAB — HEPATITIS B SURFACE ANTIGEN: HEP B S AG: NEGATIVE

## 2016-12-09 LAB — I-STAT ARTERIAL BLOOD GAS, ED
ACID-BASE DEFICIT: 4 mmol/L — AB (ref 0.0–2.0)
Bicarbonate: 19.8 mmol/L — ABNORMAL LOW (ref 20.0–28.0)
O2 Saturation: 90 %
PH ART: 7.392 (ref 7.350–7.450)
TCO2: 21 mmol/L (ref 0–100)
pCO2 arterial: 32.5 mmHg (ref 32.0–48.0)
pO2, Arterial: 59 mmHg — ABNORMAL LOW (ref 83.0–108.0)

## 2016-12-09 LAB — MRSA PCR SCREENING: MRSA by PCR: NEGATIVE

## 2016-12-09 LAB — LITHIUM LEVEL
LITHIUM LVL: 1.39 mmol/L — AB (ref 0.60–1.20)
LITHIUM LVL: 1.48 mmol/L — AB (ref 0.60–1.20)

## 2016-12-09 LAB — CK: CK TOTAL: 87 U/L (ref 49–397)

## 2016-12-09 LAB — HIV ANTIBODY (ROUTINE TESTING W REFLEX): HIV SCREEN 4TH GENERATION: NONREACTIVE

## 2016-12-09 LAB — GLUCOSE, CAPILLARY

## 2016-12-09 MED ORDER — NEPRO/CARBSTEADY PO LIQD
237.0000 mL | Freq: Three times a day (TID) | ORAL | Status: DC | PRN
  Filled 2016-12-09: qty 237

## 2016-12-09 MED ORDER — AZELASTINE HCL 0.1 % NA SOLN
2.0000 | Freq: Every day | NASAL | Status: DC
  Administered 2016-12-09 – 2016-12-12 (×3): 2 via NASAL
  Filled 2016-12-09: qty 30

## 2016-12-09 MED ORDER — PROPRANOLOL HCL ER 60 MG PO CP24
60.0000 mg | ORAL_CAPSULE | Freq: Every day | ORAL | Status: DC
  Administered 2016-12-09 – 2016-12-12 (×3): 60 mg via ORAL
  Filled 2016-12-09 (×4): qty 1

## 2016-12-09 MED ORDER — PENTAFLUOROPROP-TETRAFLUOROETH EX AERO: 1.0000 "application " | INHALATION_SPRAY | CUTANEOUS | Status: DC | PRN

## 2016-12-09 MED ORDER — DEXTROSE 5 % IV SOLN
1.0000 g | INTRAVENOUS | Status: DC
  Administered 2016-12-09 – 2016-12-12 (×4): 1 g via INTRAVENOUS
  Filled 2016-12-09 (×4): qty 10

## 2016-12-09 MED ORDER — SODIUM CHLORIDE 0.9 % IV SOLN: 100.0000 mL | INTRAVENOUS | Status: DC | PRN

## 2016-12-09 MED ORDER — LIDOCAINE-PRILOCAINE 2.5-2.5 % EX CREA: 1.0000 "application " | TOPICAL_CREAM | CUTANEOUS | Status: DC | PRN

## 2016-12-09 MED ORDER — CALCIUM CARBONATE ANTACID 1250 MG/5ML PO SUSP
500.0000 mg | Freq: Four times a day (QID) | ORAL | Status: DC | PRN
  Filled 2016-12-09: qty 5

## 2016-12-09 MED ORDER — HEPARIN SODIUM (PORCINE) 1000 UNIT/ML DIALYSIS: 1000.0000 [IU] | INTRAMUSCULAR | Status: DC | PRN

## 2016-12-09 MED ORDER — LIDOCAINE HCL (PF) 1 % IJ SOLN: 5.0000 mL | INTRAMUSCULAR | Status: DC | PRN

## 2016-12-09 MED ORDER — CAMPHOR-MENTHOL 0.5-0.5 % EX LOTN: 1.0000 "application " | TOPICAL_LOTION | Freq: Three times a day (TID) | CUTANEOUS | Status: DC | PRN

## 2016-12-09 MED ORDER — SODIUM CHLORIDE 0.9 % IV SOLN
INTRAVENOUS | Status: DC
  Administered 2016-12-09 (×2): via INTRAVENOUS

## 2016-12-09 MED ORDER — SODIUM CHLORIDE 0.9% FLUSH
3.0000 mL | Freq: Two times a day (BID) | INTRAVENOUS | Status: DC
  Administered 2016-12-09 – 2016-12-12 (×5): 3 mL via INTRAVENOUS

## 2016-12-09 MED ORDER — ONDANSETRON HCL 4 MG/2ML IJ SOLN: 4.0000 mg | Freq: Four times a day (QID) | INTRAMUSCULAR | Status: DC | PRN

## 2016-12-09 MED ORDER — ONDANSETRON HCL 4 MG PO TABS: 4.0000 mg | ORAL_TABLET | Freq: Four times a day (QID) | ORAL | Status: DC | PRN

## 2016-12-09 MED ORDER — ACETAMINOPHEN 325 MG PO TABS
650.0000 mg | ORAL_TABLET | Freq: Four times a day (QID) | ORAL | Status: DC | PRN
  Administered 2016-12-09 (×2): 650 mg via ORAL
  Filled 2016-12-09 (×2): qty 2

## 2016-12-09 MED ORDER — HYDROXYZINE HCL 25 MG PO TABS: 25.0000 mg | ORAL_TABLET | Freq: Three times a day (TID) | ORAL | Status: DC | PRN

## 2016-12-09 MED ORDER — QUETIAPINE FUMARATE ER 200 MG PO TB24
200.0000 mg | ORAL_TABLET | Freq: Every day | ORAL | Status: DC
  Administered 2016-12-09 – 2016-12-11 (×2): 200 mg via ORAL
  Filled 2016-12-09 (×3): qty 1

## 2016-12-09 MED ORDER — DOCUSATE SODIUM 283 MG RE ENEM
1.0000 | ENEMA | RECTAL | Status: DC | PRN
Start: 2016-12-09 — End: 2016-12-12
  Filled 2016-12-09: qty 1

## 2016-12-09 MED ORDER — HALOPERIDOL LACTATE 5 MG/ML IJ SOLN
2.0000 mg | Freq: Four times a day (QID) | INTRAMUSCULAR | Status: DC | PRN
  Administered 2016-12-09 – 2016-12-12 (×2): 2 mg via INTRAVENOUS
  Filled 2016-12-09 (×2): qty 1

## 2016-12-09 MED ORDER — ALTEPLASE 2 MG IJ SOLR: 2.0000 mg | Freq: Once | INTRAMUSCULAR | Status: DC | PRN

## 2016-12-09 MED ORDER — SORBITOL 70 % SOLN
30.0000 mL | Status: DC | PRN
  Filled 2016-12-09: qty 30

## 2016-12-09 MED ORDER — CHLORHEXIDINE GLUCONATE 0.12 % MT SOLN
15.0000 mL | Freq: Two times a day (BID) | OROMUCOSAL | Status: DC
  Administered 2016-12-09 – 2016-12-12 (×6): 15 mL via OROMUCOSAL
  Filled 2016-12-09 (×5): qty 15

## 2016-12-09 MED ORDER — ENOXAPARIN SODIUM 30 MG/0.3ML ~~LOC~~ SOLN
30.0000 mg | Freq: Every day | SUBCUTANEOUS | Status: DC
  Administered 2016-12-09 – 2016-12-10 (×2): 30 mg via SUBCUTANEOUS
  Filled 2016-12-09 (×3): qty 0.3

## 2016-12-09 MED ORDER — ALBUTEROL SULFATE (2.5 MG/3ML) 0.083% IN NEBU: 2.5000 mg | INHALATION_SOLUTION | RESPIRATORY_TRACT | Status: DC | PRN

## 2016-12-09 MED ORDER — ZOLPIDEM TARTRATE 5 MG PO TABS: 5.0000 mg | ORAL_TABLET | Freq: Every evening | ORAL | Status: DC | PRN

## 2016-12-09 MED ORDER — QUETIAPINE FUMARATE ER 300 MG PO TB24: 300.0000 mg | ORAL_TABLET | Freq: Every day | ORAL | Status: DC

## 2016-12-09 MED ORDER — ORAL CARE MOUTH RINSE
15.0000 mL | Freq: Two times a day (BID) | OROMUCOSAL | Status: DC
  Administered 2016-12-09 – 2016-12-12 (×5): 15 mL via OROMUCOSAL

## 2016-12-09 MED ORDER — KETOTIFEN FUMARATE 0.025 % OP SOLN
1.0000 [drp] | Freq: Two times a day (BID) | OPHTHALMIC | Status: DC
  Administered 2016-12-09 – 2016-12-12 (×6): 1 [drp] via OPHTHALMIC
  Filled 2016-12-09 (×2): qty 5

## 2016-12-09 MED ORDER — ACETAMINOPHEN 650 MG RE SUPP
650.0000 mg | Freq: Four times a day (QID) | RECTAL | Status: DC | PRN
  Administered 2016-12-10: 650 mg via RECTAL
  Filled 2016-12-09 (×2): qty 1

## 2016-12-09 NOTE — Progress Notes (Signed)
Progressive febrility, tachypnea RR in 40's c/o SOB, NO urine this shift via condom cath. Respiratory therapy called to evaluate. o2 placed at 2L sats from 90% to 97%, remains with tachypnrea. Haldol given to see if that would decrease respirations, , did not. MD paged for further evaluation

## 2016-12-09 NOTE — Progress Notes (Signed)
Subjective: Interval History: has no complaint of, but not sure what has been done.  Objective: Vital signs in last 24 hours: Temp:  [98.4 F (36.9 C)-101.7 F (38.7 C)] 100.7 F (38.2 C) (08/20 1322) Pulse Rate:  [89-104] 94 (08/20 0927) Resp:  [15-33] 31 (08/20 1131) BP: (100-141)/(49-88) 141/74 (08/20 1131) SpO2:  [90 %-100 %] 95 % (08/20 0927) Weight change:   Intake/Output from previous day: 08/19 0701 - 08/20 0700 In: 1000 [IV Piggyback:1000] Out: -  Intake/Output this shift: Total I/O In: -  Out: -500   General appearance: cooperative, no distress, mildly obese, slowed mentation and plethoric Neck: RIJ cath Resp: clear to auscultation bilaterally Cardio: S1, S2 normal GI: obese, pos bs , soft Extremities: edema 1+  Lab Results:  Recent Labs  12/09/16 0034 12/09/16 0528  WBC 16.7* 14.7*  HGB 13.3 13.6  HCT 40.3 40.8  PLT 166 175   BMET:  Recent Labs  12/08/16 2132 12/09/16 0504  NA 137 138  K 3.9 3.8  CL 104 108  CO2 20* 21*  GLUCOSE 85 116*  BUN 54* 48*  CREATININE 3.87* 2.61*  CALCIUM 9.0 9.0   No results for input(s): PTH in the last 72 hours. Iron Studies: No results for input(s): IRON, TIBC, TRANSFERRIN, FERRITIN in the last 72 hours.  Studies/Results: Ct Head Wo Contrast  Result Date: 12/08/2016 CLINICAL DATA:  Possible overdose.  Slurred speech. EXAM: CT HEAD WITHOUT CONTRAST TECHNIQUE: Contiguous axial images were obtained from the base of the skull through the vertex without intravenous contrast. COMPARISON:  None. FINDINGS: Brain: No evidence of acute infarction, hemorrhage, hydrocephalus, extra-axial collection or mass lesion/mass effect. Vascular: No hyperdense vessel or unexpected calcification. Skull: Normal. Negative for fracture or focal lesion. Sinuses/Orbits: Mild polypoid mucosal thickening of the maxillary sinuses. Postsurgical changes in the maxillary sinuses. Other: None. IMPRESSION: No acute intracranial abnormality.  Electronically Signed   By: Fidela Salisbury M.D.   On: 12/08/2016 22:27   US Renal  Result Date: 12/09/2016 CLINICAL DATA:  Acute kidney injury. History of prostatectomy, hypertension and hyperlipidemia. EXAM: RENAL / URINARY TRACT ULTRASOUND COMPLETE COMPARISON:  None. FINDINGS: Right Kidney: Length: 13.7 cm. Echogenicity within normal limits. No mass or hydronephrosis visualized. Left Kidney: Length: 12.9 cm. Echogenicity within normal limits. No mass or hydronephrosis visualized. Bladder: Mildly echogenic layering debris within urinary bladder. IMPRESSION: Normal renal ultrasound. Debris within bladder.  Recommend correlation with urinary analysis. Electronically Signed   By: Elon Alas M.D.   On: 12/09/2016 03:03   Dg Chest Port 1 View  Result Date: 12/09/2016 CLINICAL DATA:  Central line placement EXAM: PORTABLE CHEST 1 VIEW COMPARISON:  None. FINDINGS: The tip of a right IJ central line is seen in the mid SVC. No pneumothorax. Low lung volumes with borderline cardiomegaly. No aortic aneurysm. Mild pulmonary venous congestion. Left lower lobe subsegmental atelectasis. IMPRESSION: 1. Right IJ central line catheter tip is seen in the mid SVC level. No pneumothorax. 2. Low lung volumes are again noted with mild vascular congestion and borderline cardiomegaly. Electronically Signed   By: Ashley Royalty M.D.   On: 12/09/2016 02:41   Dg Chest Port 1 View  Result Date: 12/08/2016 CLINICAL DATA:  Altered mental status. EXAM: PORTABLE CHEST 1 VIEW COMPARISON:  Frontal and lateral views 04/21/2008 FINDINGS: Very low lung volumes limit assessment. Probable bibasilar atelectasis. Heart appears prominent in size, likely accentuated by technique. No confluent airspace disease. No large pleural effusion or pneumothorax. Osseous structures are grossly intact. IMPRESSION: Low  lung volumes limiting assessment. Bibasilar atelectasis with prominent heart size likely accentuated by portable technique and low  lung volumes. Recommend follow-up PA and lateral views based on clinical concern. Electronically Signed   By: Jeb Levering M.D.   On: 12/08/2016 23:10    I have reviewed the patient's current medications.  Assessment/Plan: 1 Li toxicity   Level still ^ but improved and not severely, will check next level.  Has  AKI probably Nicoletta Dress but not totally clear.  Urine vol not determined yet and need to make sure is ok. 2 Confusion ?Li vs other 3 polysubstance abuse P IVF, follow urine , check Li at 5 pm   LOS: 1 day   Sharita Bienaime L 12/09/2016,2:52 PM

## 2016-12-09 NOTE — Progress Notes (Signed)
Patient arrived to unit by ED stretcher.  Reviewed treatment plan and this RN agrees with plan.  Report received from bedside RN, Freida Busman.    Patient opens eyes to voice, commands, oriented to self and place.   Lung sounds diminished and clear to ausculation in all fields. No edema. Cardiac:  NSR.  Removed caps and cleansed RIJ catheter with chlorhedxidine.  Aspirated ports of heparin and flushed them with saline per protocol.  Connected and secured lines, initiated treatment at 0445.  UF Goal of 0 mL and net fluid removal - 0.5 L.  Will continue to monitor.

## 2016-12-09 NOTE — Progress Notes (Signed)
Patient only oriented to self, became very agitated and attempting to get out of bed. Patient pulling out IVs, pulling off leads, pulling off condom catheter, and pulling off hospital gown. Patient very diaphoretic. Patient sweating through his gown and through IV dressings. Patient's speech is mostly clear but what he is saying doesn't make sense at times. Patient had trouble finding words for TV and car but then repeated those words over and over to answer other questions. Patient's respirations are shallow and in the 30's. Heart rate has also been slightly elevated and in the 110's. Patient does seem to do better with a staff member at the bedside to continually orient patient.

## 2016-12-09 NOTE — Progress Notes (Signed)
Call the office of Dr. Raelyn Number call the [facility]. Was told by a representative on the phone that patient should be on Lithium CR 600mg /hs but this is not reflective of pharmacy refill records: -- Lithium CR 300mg  #90 ( #1 in AM, #2 in PM) AND Lithium CR 450mg /hs (both of these prescriptions were filled together in May 16, June 30, and August).  Unable to reach father at home currently. Will try again tomorrow.  Colin Kelley S. Alford Highland, PharmD, [place] Clinical Staff Pharmacist Pager [phone]

## 2016-12-09 NOTE — Progress Notes (Signed)
HD RN attempted report from ED, ED RN to assess patient and phone HD for report momentarily.

## 2016-12-09 NOTE — Procedures (Signed)
HD line placement   Indication: lithium toxicity  Discussed with the patient and its emergent procedure. Time out Complete sterile conditions  Local administration of lidocaine US guided insertion of Rt IJ central line Guidewire visualized by [place] No complications, cxr is ordered  Samul Dada, MD CCM attending

## 2016-12-09 NOTE — ED Notes (Signed)
Received call for Dr. Corinna Lines, stating that placement of pt's HD catheter in the right IJ is confirmed and cleared for usage.

## 2016-12-09 NOTE — Progress Notes (Signed)
[facility] TEAM 1 - Stepdown/ICU TEAM  SHANA YOUNGE  XBM:[id number] DOB: Jan 09, 1961 DOA: 12/08/2016 PCP: Ann Held, MD    Brief Narrative:  56 year old male with a history of polysubstance abuse, bipolar disorder, chronic malunion ankle fracture, OSA, HTN, and HLD who was brought to the [facility] ED via EMS after he was found to be very lethargic by his father.  He had reportedly "been in bed for over 24 hours."  In the emergency room he was found to be suffering with lithium toxicity as well as acute renal failure.  Nephrology was contacted and the patient was transferred to [facility] to undergo emergent dialysis.  Subjective: Patient is somewhat lethargic and confused but is able to answer some simple questions.  He does not appear to be in acute respiratory distress.  There is no evidence of uncontrolled pain.  I have spoken to his father at the bedside at length.  Assessment & Plan:  Acute Lithium toxicity Family reports symptoms for up to a week - to continue dialysis per Nephrology - follow lithium levels every 12 hours - when more clinically stable/alert we will require Psych consult as it is unclear if this was intentional or accidental  Acute renal failure Baseline creatinine 0.28 Aug 2016 - renal ultrasound without acute findings - hydrate - avoid nephrotoxins - follow   History of polysubstance abuse - cocaine and EtOH UDS and EtOH negative at time of presentation - family does not feel he has been using lately   Probable UTI Dirty UA, elevated WBC, and temp of 101.7 - begin empiric rocephin - send urine for cx  Bipolar disorder Resume seroquel at lower dose - obviously no Li for now - consider resuming lamictal as mental status improves  HTN Reasonably controlled at the present time  Chronic malunion ankle fracture Avoid narcotics for now until mental status improved  DVT prophylaxis: Lovenox Code Status: FULL CODE Family Communication: Spoke with father  bedside Disposition Plan: SDU - ongoing dialysis - will have to consider formal suicide precautions if patient is noncooperative when more alert  Consultants:  Nephrology    Procedures: 8/20 R IJ HD cath placement   Antimicrobials:  Rocephin 8/20 >  Objective: Blood pressure 124/67, pulse 94, temperature 100.3 F (37.9 C), temperature source Oral, resp. rate (!) 25, SpO2 95 %.  Intake/Output Summary (Last 24 hours) at 12/09/16 1125 Last data filed at 12/09/16 0745  Gross per 24 hour  Intake             1000 ml  Output             -500 ml  Net             1500 ml   Filed Weights    Examination: General: No acute respiratory distress Lungs: Clear to auscultation bilaterally without wheezes or crackles Cardiovascular: Tachycardic but regular with no appreciable murmur gallop or rub Abdomen: Nontender, overweight, soft, bowel sounds positive, no rebound, no ascites, no appreciable mass Extremities: No significant cyanosis, clubbing, or edema bilateral lower extremities  CBC:  Recent Labs Lab 12/08/16 2132 12/09/16 0034 12/09/16 0528  WBC 19.4* 16.7* 14.7*  NEUTROABS 17.2*  --   --   HGB 14.0 13.3 13.6  HCT 42.4 40.3 40.8  MCV 92.0 91.6 91.3  PLT 178 166 027   Basic Metabolic Panel:  Recent Labs Lab 12/08/16 2132 12/09/16 0504  NA 137 138  K 3.9 3.8  CL 104 108  CO2 20* 21*  GLUCOSE 85 116*  BUN 54* 48*  CREATININE 3.87* 2.61*  CALCIUM 9.0 9.0   GFR: CrCl cannot be calculated (Unknown ideal weight.).  Liver Function Tests:  Recent Labs Lab 12/08/16 2132  AST 14*  ALT 13*  ALKPHOS 64  BILITOT 1.4*  PROT 6.3*  ALBUMIN 4.1    Cardiac Enzymes:  Recent Labs Lab 12/08/16 2132  CKTOTAL 87    CBG:  Recent Labs Lab 12/08/16 2140 12/08/16 2142 12/08/16 2235  GLUCAP <10* 49* 175*    Scheduled Meds: . chlorhexidine  15 mL Mouth Rinse BID  . enoxaparin (LOVENOX) injection  30 mg Subcutaneous Daily  . mouth rinse  15 mL Mouth Rinse  [id number]  . sodium chloride flush  3 mL Intravenous Q12H     LOS: 1 day   Cherene Altes, MD [facility] Office  [phone] Pager - Text Page per Amion as per below:  On-Call/Text Page:      [email or web address]      password [email or web address]  If 7PM-7AM, please contact night-coverage [email or web address] Password [email or web address] 12/09/2016, 11:25 AM

## 2016-12-10 ENCOUNTER — Encounter (HOSPITAL_COMMUNITY): Payer: Self-pay

## 2016-12-10 DIAGNOSIS — T56892A Toxic effect of other metals, intentional self-harm, initial encounter: Secondary | ICD-10-CM

## 2016-12-10 DIAGNOSIS — F1910 Other psychoactive substance abuse, uncomplicated: Secondary | ICD-10-CM

## 2016-12-10 DIAGNOSIS — F3111 Bipolar disorder, current episode manic without psychotic features, mild: Secondary | ICD-10-CM

## 2016-12-10 DIAGNOSIS — S82892A Other fracture of left lower leg, initial encounter for closed fracture: Secondary | ICD-10-CM

## 2016-12-10 LAB — COMPREHENSIVE METABOLIC PANEL
ALT: 12 U/L — ABNORMAL LOW (ref 17–63)
ANION GAP: 7 (ref 5–15)
AST: 11 U/L — ABNORMAL LOW (ref 15–41)
Albumin: 3.2 g/dL — ABNORMAL LOW (ref 3.5–5.0)
Alkaline Phosphatase: 65 U/L (ref 38–126)
BILIRUBIN TOTAL: 1.3 mg/dL — AB (ref 0.3–1.2)
BUN: 30 mg/dL — AB (ref 6–20)
CO2: 25 mmol/L (ref 22–32)
Calcium: 8.9 mg/dL (ref 8.9–10.3)
Chloride: 108 mmol/L (ref 101–111)
Creatinine, Ser: 1.58 mg/dL — ABNORMAL HIGH (ref 0.61–1.24)
GFR, EST AFRICAN AMERICAN: 54 mL/min — AB (ref >60)
GFR, EST NON AFRICAN AMERICAN: 47 mL/min — AB (ref >60)
Glucose, Bld: 141 mg/dL — ABNORMAL HIGH (ref 65–99)
POTASSIUM: 3.8 mmol/L (ref 3.5–5.1)
Sodium: 140 mmol/L (ref 135–145)
TOTAL PROTEIN: 5.7 g/dL — AB (ref 6.5–8.1)

## 2016-12-10 LAB — CBC
HEMATOCRIT: 39.9 % (ref 39.0–52.0)
HEMOGLOBIN: 13 g/dL (ref 13.0–17.0)
MCH: 29.8 pg (ref 26.0–34.0)
MCHC: 32.6 g/dL (ref 30.0–36.0)
MCV: 91.5 fL (ref 78.0–100.0)
Platelets: 144 10*3/uL — ABNORMAL LOW (ref 150–400)
RBC: 4.36 MIL/uL (ref 4.22–5.81)
RDW: 13.8 % (ref 11.5–15.5)
WBC: 9.9 10*3/uL (ref 4.0–10.5)

## 2016-12-10 LAB — URINALYSIS, ROUTINE W REFLEX MICROSCOPIC
BILIRUBIN URINE: NEGATIVE
Glucose, UA: 50 mg/dL — AB
KETONES UR: 20 mg/dL — AB
Nitrite: NEGATIVE
Specific Gravity, Urine: 1.015 (ref 1.005–1.030)
pH: 6 (ref 5.0–8.0)

## 2016-12-10 LAB — LITHIUM LEVEL
LITHIUM LVL: 1.31 mmol/L — AB (ref 0.60–1.20)
Lithium Lvl: 1.07 mmol/L (ref 0.60–1.20)

## 2016-12-10 LAB — HEPATITIS B CORE ANTIBODY, TOTAL: Hep B Core Total Ab: NEGATIVE

## 2016-12-10 LAB — HEPATITIS B SURFACE ANTIBODY,QUALITATIVE: HEP B S AB: NONREACTIVE

## 2016-12-10 MED ORDER — ENOXAPARIN SODIUM 40 MG/0.4ML ~~LOC~~ SOLN
40.0000 mg | Freq: Every day | SUBCUTANEOUS | Status: DC
  Administered 2016-12-11 – 2016-12-12 (×2): 40 mg via SUBCUTANEOUS
  Filled 2016-12-10 (×2): qty 0.4

## 2016-12-10 NOTE — Progress Notes (Signed)
BiPAP in room on stand by. Will continue to monitor PT throughout the night.

## 2016-12-10 NOTE — Progress Notes (Signed)
Subjective: Interval History: has no complaint .  Objective: Vital signs in last 24 hours: Temp:  [98.4 F (36.9 C)-103.4 F (39.7 C)] 101.7 F (38.7 C) (08/21 0325) Pulse Rate:  [85-95] 85 (08/21 0325) Resp:  [18-39] 25 (08/21 0325) BP: (110-171)/(67-93) 120/73 (08/21 0325) SpO2:  [93 %-100 %] 100 % (08/21 0407) FiO2 (%):  [40 %] 40 % (08/21 0407) Weight:  [99.4 kg (219 lb 2.2 oz)] 99.4 kg (219 lb 2.2 oz) (08/20 2351) Weight change:   Intake/Output from previous day: 08/20 0701 - 08/21 0700 In: 3054.6 [P.O.:340; I.V.:2664.6; IV Piggyback:50] Out: 1100 [Urine:1600] Intake/Output this shift: No intake/output data recorded.  General appearance: slowed mentation and in restraints, lethargic, on CPAP Neck: IJ HD cath Resp: diminished breath sounds bilaterally and rales bibasilar Cardio: S1, S2 normal and systolic murmur: holosystolic 2/6, blowing at apex GI: soft, non-tender; bowel sounds normal; no masses,  no organomegaly Extremities: extremities normal, atraumatic, no cyanosis or edema  Lab Results:  Recent Labs  12/09/16 0528 12/10/16 0504  WBC 14.7* 9.9  HGB 13.6 13.0  HCT 40.8 39.9  PLT 175 144*   BMET:  Recent Labs  12/09/16 0504 12/10/16 0504  NA 138 140  K 3.8 3.8  CL 108 108  CO2 21* 25  GLUCOSE 116* 141*  BUN 48* 30*  CREATININE 2.61* 1.58*  CALCIUM 9.0 8.9   No results for input(s): PTH in the last 72 hours. Iron Studies: No results for input(s): IRON, TIBC, TRANSFERRIN, FERRITIN in the last 72 hours.  Studies/Results: Ct Head Wo Contrast  Result Date: 12/08/2016 CLINICAL DATA:  Possible overdose.  Slurred speech. EXAM: CT HEAD WITHOUT CONTRAST TECHNIQUE: Contiguous axial images were obtained from the base of the skull through the vertex without intravenous contrast. COMPARISON:  None. FINDINGS: Brain: No evidence of acute infarction, hemorrhage, hydrocephalus, extra-axial collection or mass lesion/mass effect. Vascular: No hyperdense vessel or  unexpected calcification. Skull: Normal. Negative for fracture or focal lesion. Sinuses/Orbits: Mild polypoid mucosal thickening of the maxillary sinuses. Postsurgical changes in the maxillary sinuses. Other: None. IMPRESSION: No acute intracranial abnormality. Electronically Signed   By: Fidela Salisbury M.D.   On: 12/08/2016 22:27   US Renal  Result Date: 12/09/2016 CLINICAL DATA:  Acute kidney injury. History of prostatectomy, hypertension and hyperlipidemia. EXAM: RENAL / URINARY TRACT ULTRASOUND COMPLETE COMPARISON:  None. FINDINGS: Right Kidney: Length: 13.7 cm. Echogenicity within normal limits. No mass or hydronephrosis visualized. Left Kidney: Length: 12.9 cm. Echogenicity within normal limits. No mass or hydronephrosis visualized. Bladder: Mildly echogenic layering debris within urinary bladder. IMPRESSION: Normal renal ultrasound. Debris within bladder.  Recommend correlation with urinary analysis. Electronically Signed   By: Elon Alas M.D.   On: 12/09/2016 03:03   Dg Chest Port 1 View  Result Date: 12/09/2016 CLINICAL DATA:  Acute respiratory distress. EXAM: PORTABLE CHEST 1 VIEW COMPARISON:  Earlier this day at 0229 hour FINDINGS: Tip of the right central line in the mid SVC. No pneumothorax. Low lung volumes again seen, similar to prior exam. Unchanged vascular congestion. Streaky left lung base atelectasis. Unchanged heart size and mediastinal contours. No pneumothorax. IMPRESSION: Persistent low lung volumes with vascular congestion. Unchanged left basilar atelectasis. No new abnormality. Electronically Signed   By: Jeb Levering M.D.   On: 12/09/2016 23:49   Dg Chest Port 1 View  Result Date: 12/09/2016 CLINICAL DATA:  Central line placement EXAM: PORTABLE CHEST 1 VIEW COMPARISON:  None. FINDINGS: The tip of a right IJ central line is seen  in the mid SVC. No pneumothorax. Low lung volumes with borderline cardiomegaly. No aortic aneurysm. Mild pulmonary venous congestion.  Left lower lobe subsegmental atelectasis. IMPRESSION: 1. Right IJ central line catheter tip is seen in the mid SVC level. No pneumothorax. 2. Low lung volumes are again noted with mild vascular congestion and borderline cardiomegaly. Electronically Signed   By: Ashley Royalty M.D.   On: 12/09/2016 02:41   Dg Chest Port 1 View  Result Date: 12/08/2016 CLINICAL DATA:  Altered mental status. EXAM: PORTABLE CHEST 1 VIEW COMPARISON:  Frontal and lateral views 04/21/2008 FINDINGS: Very low lung volumes limit assessment. Probable bibasilar atelectasis. Heart appears prominent in size, likely accentuated by technique. No confluent airspace disease. No large pleural effusion or pneumothorax. Osseous structures are grossly intact. IMPRESSION: Low lung volumes limiting assessment. Bibasilar atelectasis with prominent heart size likely accentuated by portable technique and low lung volumes. Recommend follow-up PA and lateral views based on clinical concern. Electronically Signed   By: Jeb Levering M.D.   On: 12/08/2016 23:10    I have reviewed the patient's current medications.  Assessment/Plan: 1 Li toxicity   Li falling, good urine output clearing now  . Initial lowering HD 2 AKI improving. Li cause, ? UTI 3 UTI  4 Fevers suspect UTI vs Asp 5 Confusion Li residual 6 substance abuse & Bipolar P ivf, follow Li, AB, cultures, pull Hd cath in am if cont improvement    LOS: 2 days   Less [place] L 12/10/2016,7:26 AM

## 2016-12-10 NOTE — Progress Notes (Signed)
Patient has not been oriented to place, time, or situation this morning. Patient will respond to name and give name and birthday when asked repeatedly. Trialing off of Bipap with 2 L nasal canula. Currently his oxygen saturation is staying in the mid to upper 90's and respiratory rate in the 20's. Attempted to get patient to swallow fluids but patient unable to suck on straw, when attempting to give patient a sip via side of the cup patient didn't swallow and fluid ran out of mouth. Patient unable to take oral medications. Attempted to swab patient's mouth but patient became upset and wouldn't let me continue. Will continue to reassess and monitor.

## 2016-12-10 NOTE — Progress Notes (Signed)
Pt currently off BIPAP. No distress noted att this time.

## 2016-12-10 NOTE — Plan of Care (Signed)
Problem: Activity: Goal: Risk for activity intolerance will decrease Outcome: Not Progressing Very dyspnic, short of breath, mentation continues to change  Problem: Fluid Volume: Goal: Ability to maintain a balanced intake and output will improve Outcome: Not Progressing Urine output diminished, cxry shows vascular congestion and atelectasis.  Problem: Nutrition: Goal: Adequate nutrition will be maintained Outcome: Not Progressing Taking some fluids in , but with bipap and mental status, has not eaten.

## 2016-12-10 NOTE — Progress Notes (Signed)
PROGRESS NOTE    Colin Kelley  WCB:[id number] DOB: 06-May-1960 DOA: 12/08/2016 PCP: Ann Held, MD   Brief Narrative:  57 y.o. 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 Father reportedly called EMS as patient was "sleeping too much". States he has been in bed for about 24 hours. They're concerned for potential overdose, apparently some of his home medications were changed about 2 weeks ago (unsure which ones). Patient unable to provide any further reliable history here.  I called and spoke to his father, who reports that he slept through the night and through the day.  He was "wobbling like he was drunk last week.  Unsteady on his feet.  He would drop things and make a mess" in the kitchen.  His father is planning to bring in his medications in the AM, but he is unsure when his medications were changed.  He came to live with his parents about 2 years ago after he was kicked out of his group home for using drugs; they do not think he has continued to use drugs since moving in with them.  He has had a malunion ankle fracture, which causes him chronic pain.     Subjective: 8/21 Tmax last 24 hours 39.7C, A/O 1 (does not aware, when, why) believes its 1981, however redirectable with prompting will tell you that he is in the hospital. Negative CP negative SOB, negative N/V, negative abdominal pain   Assessment & Plan:   Principal Problem:   Lithium toxicity Active Problems:   Acute metabolic encephalopathy   Acute renal failure (ARF) (HCC)   Acute Lithium toxicity, intentional self-harm -Family reports symptoms for up to a week  -HD per nephrology: Patient has received one session HD per nephrology no further requirement for HD at this time. -Continue to monitor lithium level  q 12hr --Psych consult when patient cognition improved   Acute renal failure(baseline Cr 0.28 Aug 2016) -Renal ultrasound: Without acute findings -Avoid nephrotoxins   Recent Labs Lab 12/08/16 2132 12/09/16 0504 12/10/16 0504  CREATININE 3.87* 2.61* 1.58*  -Improving with hydration: Continue normal saline 146ml/hr   Polysubstance abuse -Cocaine and EtOH -UDS negative for illicit drugs, negative EtOH  UTI? -Patient urine extremely dirty, leukocytosis, fever. -Continue empiric antibiotics -Culture pending   Bipolar disorder -Patient currently not consuming PO medication, if cognition has not improved by 8/23 place [employer] 4 medication/nourishment   Essential HTN -Reasonably controlled at the present time   Chronic malunion Left ankle fracture -Avoid narcotics for now until mental status improved    DVT prophylaxis: Lovenox Code Status: Full Family Communication: None Disposition Plan: TBD   Consultants:  Nephrology    Procedures/Significant Events:     I have personally reviewed and interpreted all radiology studies and my findings are as above.  VENTILATOR SETTINGS: None   Cultures 8/20 HIV negative 8/20 hepatitis B negative 8/21 blood pending 8/21 urine pending    Antimicrobials: Anti-infectives    Start     Stop   12/09/16 1145  cefTRIAXone (ROCEPHIN) 1 g in dextrose 5 % 50 mL IVPB             Devices    LINES / TUBES:      Continuous Infusions: . sodium chloride 100 mL/hr at 12/10/16 0034  . sodium chloride    . sodium chloride    .  cefTRIAXone (ROCEPHIN)  IV Stopped (12/09/16 1349)     Objective: Vitals:   12/10/16 0325 12/10/16 0407 12/10/16 0710 12/10/16 0741  BP: 120/73  122/78   Pulse: 85  85   Resp: (!) 25  (!) 23   Temp: (!) 101.7 F (38.7 C)   (!) 100.9 F (38.3 C)  TempSrc: Axillary   Axillary  SpO2: 100% 100% 98%   Weight:      Height:        Intake/Output Summary (Last 24 hours) at 12/10/16 0750 Last  data filed at 12/10/16 0500  Gross per 24 hour  Intake          3054.58 ml  Output             1600 ml  Net          1454.58 ml   Filed Weights   12/09/16 2351  Weight: 219 lb 2.2 oz (99.4 kg)    Examination:  General:A/O 1 (does not aware, when, why) believes its 1981, No acute respiratory distress Eyes: negative scleral hemorrhage, negative anisocoria, negative icterus ENT: Negative Runny nose, negative gingival bleeding, Neck:  Negative scars, masses, torticollis, lymphadenopathy, JVD, right IJ Vas-Cath in place covered and clean negative sign of infection Lungs: Clear to auscultation bilaterally without wheezes or crackles Cardiovascular: Regular rate and rhythm without murmur gallop or rub normal S1 and S2 Abdomen: negative abdominal pain, nondistended, positive soft, bowel sounds, no rebound, no ascites, no appreciable mass Extremities: No significant cyanosis, clubbing, or edema bilateral lower extremities Skin: Negative rashes, lesions, ulcers Psychiatric:  Patient cooperative but extremely confused (per nursing staff significant improvement from 8/20)  Central nervous system:  Cranial nerves II through XII intact, tongue/uvula midline, all extremities muscle strength 5/5, sensation intact throughout,negative dysarthria, positive expressive aphasia, positive receptive aphasia.  .     Data Reviewed: Care during the described time interval was provided by me .  I have reviewed this patient's available data, including medical history, events of note, physical examination, and all test results as part of my evaluation.   CBC:  Recent Labs Lab 12/08/16 2132 12/09/16 0034 12/09/16 0528 12/10/16 0504  WBC 19.4* 16.7* 14.7* 9.9  NEUTROABS 17.2*  --   --   --   HGB 14.0 13.3 13.6 13.0  HCT 42.4 40.3 40.8 39.9  MCV 92.0 91.6 91.3 91.5  PLT 178 166 175 784*   Basic Metabolic Panel:  Recent Labs Lab 12/08/16 2132 12/09/16 0504 12/10/16 0504  NA 137 138 140  K 3.9  3.8 3.8  CL 104 108 108  CO2 20* 21* 25  GLUCOSE 85 116* 141*  BUN 54* 48* 30*  CREATININE 3.87* 2.61* 1.58*  CALCIUM 9.0 9.0 8.9   GFR: Estimated Creatinine Clearance: 60 mL/min (A) (by C-G formula based on SCr of 1.58 mg/dL (H)). Liver Function Tests:  Recent Labs Lab 12/08/16 2132 12/10/16 0504  AST 14* 11*  ALT 13* 12*  ALKPHOS 64 65  BILITOT 1.4* 1.3*  PROT 6.3* 5.7*  ALBUMIN 4.1 3.2*   No results for input(s): LIPASE, AMYLASE in the last 168 hours. No results for input(s): AMMONIA in the last 168 hours. Coagulation Profile: No results for input(s): INR, PROTIME in the last 168 hours. Cardiac Enzymes:  Recent Labs Lab 12/08/16 2132  CKTOTAL 87   BNP (last 3 results) No results for input(s): PROBNP in the last 8760 hours. HbA1C: No results for input(s): HGBA1C in the last 72 hours. CBG:  Recent  Labs Lab 12/08/16 2140 12/08/16 2142 12/08/16 2235  GLUCAP <10*  <10* 49* 175*   Lipid Profile: No results for input(s): CHOL, HDL, LDLCALC, TRIG, CHOLHDL, LDLDIRECT in the last 72 hours. Thyroid Function Tests: No results for input(s): TSH, T4TOTAL, FREET4, T3FREE, THYROIDAB in the last 72 hours. Anemia Panel: No results for input(s): VITAMINB12, FOLATE, FERRITIN, TIBC, IRON, RETICCTPCT in the last 72 hours. Urine analysis:    Component Value Date/Time   COLORURINE AMBER (A) 12/10/2016 0100   APPEARANCEUR TURBID (A) 12/10/2016 0100   LABSPEC 1.015 12/10/2016 0100   PHURINE 6.0 12/10/2016 0100   GLUCOSEU 50 (A) 12/10/2016 0100   HGBUR SMALL (A) 12/10/2016 0100   BILIRUBINUR NEGATIVE 12/10/2016 0100   KETONESUR 20 (A) 12/10/2016 0100   PROTEINUR >=300 (A) 12/10/2016 0100   NITRITE NEGATIVE 12/10/2016 0100   LEUKOCYTESUR LARGE (A) 12/10/2016 0100   Sepsis Labs: @LABRCNTIP (procalcitonin:4,lacticidven:4)  ) Recent Results (from the past 240 hour(s))  MRSA PCR Screening     Status: None   Collection Time: 12/09/16  9:37 AM  Result Value Ref Range  Status   MRSA by PCR NEGATIVE NEGATIVE Final    Comment:        The GeneXpert MRSA Assay (FDA approved for NASAL specimens only), is one component of a comprehensive MRSA colonization surveillance program. It is not intended to diagnose MRSA infection nor to guide or monitor treatment for MRSA infections.          Radiology Studies: Ct Head Wo Contrast  Result Date: 12/08/2016 CLINICAL DATA:  Possible overdose.  Slurred speech. EXAM: CT HEAD WITHOUT CONTRAST TECHNIQUE: Contiguous axial images were obtained from the base of the skull through the vertex without intravenous contrast. COMPARISON:  None. FINDINGS: Brain: No evidence of acute infarction, hemorrhage, hydrocephalus, extra-axial collection or mass lesion/mass effect. Vascular: No hyperdense vessel or unexpected calcification. Skull: Normal. Negative for fracture or focal lesion. Sinuses/Orbits: Mild polypoid mucosal thickening of the maxillary sinuses. Postsurgical changes in the maxillary sinuses. Other: None. IMPRESSION: No acute intracranial abnormality. Electronically Signed   By: Fidela Salisbury M.D.   On: 12/08/2016 22:27   US Renal  Result Date: 12/09/2016 CLINICAL DATA:  Acute kidney injury. History of prostatectomy, hypertension and hyperlipidemia. EXAM: RENAL / URINARY TRACT ULTRASOUND COMPLETE COMPARISON:  None. FINDINGS: Right Kidney: Length: 13.7 cm. Echogenicity within normal limits. No mass or hydronephrosis visualized. Left Kidney: Length: 12.9 cm. Echogenicity within normal limits. No mass or hydronephrosis visualized. Bladder: Mildly echogenic layering debris within urinary bladder. IMPRESSION: Normal renal ultrasound. Debris within bladder.  Recommend correlation with urinary analysis. Electronically Signed   By: Elon Alas M.D.   On: 12/09/2016 03:03   Dg Chest Port 1 View  Result Date: 12/09/2016 CLINICAL DATA:  Acute respiratory distress. EXAM: PORTABLE CHEST 1 VIEW COMPARISON:  Earlier this day  at 0229 hour FINDINGS: Tip of the right central line in the mid SVC. No pneumothorax. Low lung volumes again seen, similar to prior exam. Unchanged vascular congestion. Streaky left lung base atelectasis. Unchanged heart size and mediastinal contours. No pneumothorax. IMPRESSION: Persistent low lung volumes with vascular congestion. Unchanged left basilar atelectasis. No new abnormality. Electronically Signed   By: Jeb Levering M.D.   On: 12/09/2016 23:49   Dg Chest Port 1 View  Result Date: 12/09/2016 CLINICAL DATA:  Central line placement EXAM: PORTABLE CHEST 1 VIEW COMPARISON:  None. FINDINGS: The tip of a right IJ central line is seen in the mid SVC. No pneumothorax. Low lung volumes  with borderline cardiomegaly. No aortic aneurysm. Mild pulmonary venous congestion. Left lower lobe subsegmental atelectasis. IMPRESSION: 1. Right IJ central line catheter tip is seen in the mid SVC level. No pneumothorax. 2. Low lung volumes are again noted with mild vascular congestion and borderline cardiomegaly. Electronically Signed   By: Ashley Royalty M.D.   On: 12/09/2016 02:41   Dg Chest Port 1 View  Result Date: 12/08/2016 CLINICAL DATA:  Altered mental status. EXAM: PORTABLE CHEST 1 VIEW COMPARISON:  Frontal and lateral views 04/21/2008 FINDINGS: Very low lung volumes limit assessment. Probable bibasilar atelectasis. Heart appears prominent in size, likely accentuated by technique. No confluent airspace disease. No large pleural effusion or pneumothorax. Osseous structures are grossly intact. IMPRESSION: Low lung volumes limiting assessment. Bibasilar atelectasis with prominent heart size likely accentuated by portable technique and low lung volumes. Recommend follow-up PA and lateral views based on clinical concern. Electronically Signed   By: Jeb Levering M.D.   On: 12/08/2016 23:10        Scheduled Meds: . azelastine  2 spray Each Nare Daily  . chlorhexidine  15 mL Mouth Rinse BID  . enoxaparin  (LOVENOX) injection  30 mg Subcutaneous Daily  . ketotifen  1 drop Both Eyes BID  . mouth rinse  15 mL Mouth Rinse q12n4p  . propranolol ER  60 mg Oral Daily  . QUEtiapine  200 mg Oral Daily  . sodium chloride flush  3 mL Intravenous Q12H   Continuous Infusions: . sodium chloride 100 mL/hr at 12/10/16 0034  . sodium chloride    . sodium chloride    . cefTRIAXone (ROCEPHIN)  IV Stopped (12/09/16 1349)     LOS: 2 days    Time spent: 40 minutes    Mikele Sifuentes, Geraldo Docker, MD [facility] Pager [phone]   If 7PM-7AM, please contact night-coverage [email or web address] Password [email or web address] 12/10/2016, 7:50 AM

## 2016-12-10 NOTE — Progress Notes (Signed)
Called the [facility] again this AM and received a different Lithium dosing from phone representative.  Placed call to Dr. Raelyn Number with the [facility] regarding Lithium therapy. Awaiting return call.  Carolee Channell S. Alford Highland, PharmD, [place] Clinical Staff Pharmacist Pager [phone]

## 2016-12-10 NOTE — Progress Notes (Signed)
Foley inserted # 16 with sterile technique without issue or resistance. 1l yellow murky urine returned. MD aware. Patient tolerated procedure well.

## 2016-12-11 DIAGNOSIS — Z87891 Personal history of nicotine dependence: Secondary | ICD-10-CM

## 2016-12-11 DIAGNOSIS — G4733 Obstructive sleep apnea (adult) (pediatric): Secondary | ICD-10-CM

## 2016-12-11 DIAGNOSIS — R4182 Altered mental status, unspecified: Secondary | ICD-10-CM

## 2016-12-11 DIAGNOSIS — F3160 Bipolar disorder, current episode mixed, unspecified: Secondary | ICD-10-CM

## 2016-12-11 DIAGNOSIS — L8990 Pressure ulcer of unspecified site, unspecified stage: Secondary | ICD-10-CM | POA: Insufficient documentation

## 2016-12-11 DIAGNOSIS — E785 Hyperlipidemia, unspecified: Secondary | ICD-10-CM

## 2016-12-11 DIAGNOSIS — I10 Essential (primary) hypertension: Secondary | ICD-10-CM

## 2016-12-11 LAB — RENAL FUNCTION PANEL
ALBUMIN: 3.1 g/dL — AB (ref 3.5–5.0)
Anion gap: 7 (ref 5–15)
BUN: 21 mg/dL — AB (ref 6–20)
CALCIUM: 9.3 mg/dL (ref 8.9–10.3)
CO2: 25 mmol/L (ref 22–32)
CREATININE: 0.89 mg/dL (ref 0.61–1.24)
Chloride: 112 mmol/L — ABNORMAL HIGH (ref 101–111)
GFR calc Af Amer: >60 mL/min (ref >60)
GLUCOSE: 146 mg/dL — AB (ref 65–99)
PHOSPHORUS: 1.8 mg/dL — AB (ref 2.5–4.6)
POTASSIUM: 3.8 mmol/L (ref 3.5–5.1)
SODIUM: 144 mmol/L (ref 135–145)

## 2016-12-11 LAB — LITHIUM LEVEL: Lithium Lvl: 0.81 mmol/L (ref 0.60–1.20)

## 2016-12-11 MED ORDER — TRAZODONE HCL 50 MG PO TABS
50.0000 mg | ORAL_TABLET | Freq: Every day | ORAL | Status: DC
  Administered 2016-12-11: 50 mg via ORAL
  Filled 2016-12-11: qty 1

## 2016-12-11 MED ORDER — LAMOTRIGINE 100 MG PO TABS
100.0000 mg | ORAL_TABLET | Freq: Every day | ORAL | Status: DC
  Administered 2016-12-11 – 2016-12-12 (×2): 100 mg via ORAL
  Filled 2016-12-11 (×2): qty 1

## 2016-12-11 MED ORDER — QUETIAPINE FUMARATE ER 300 MG PO TB24
300.0000 mg | ORAL_TABLET | Freq: Every day | ORAL | Status: DC
  Administered 2016-12-12: 300 mg via ORAL
  Filled 2016-12-11: qty 1

## 2016-12-11 MED ORDER — LISINOPRIL 40 MG PO TABS
40.0000 mg | ORAL_TABLET | Freq: Every day | ORAL | Status: DC
  Administered 2016-12-11 – 2016-12-12 (×2): 40 mg via ORAL
  Filled 2016-12-11: qty 1
  Filled 2016-12-11: qty 2

## 2016-12-11 MED ORDER — GABAPENTIN 400 MG PO CAPS
400.0000 mg | ORAL_CAPSULE | Freq: Two times a day (BID) | ORAL | Status: DC
  Administered 2016-12-11 – 2016-12-12 (×2): 400 mg via ORAL
  Filled 2016-12-11 (×2): qty 1

## 2016-12-11 MED ORDER — GABAPENTIN 800 MG PO TABS
400.0000 mg | ORAL_TABLET | Freq: Two times a day (BID) | ORAL | Status: DC
  Filled 2016-12-11: qty 0.5

## 2016-12-11 NOTE — Progress Notes (Signed)
Progress Note    Colin Kelley  UXN:[id number] DOB: [birth year]  DOA: 12/08/2016 PCP: Ann Held, MD    Brief Narrative:   Chief complaint: Follow-up AMS Medical records reviewed and are as summarized below:  Colin Kelley is an 56 y.o. male with a PMH of polysubstance abuse, OSA, hypertension, hyperlipidemia and bipolar disorder who was admitted 12/08/16 with mental status changes/somnolence and unsteadiness on feet. Upon evaluation, he was found to be lithium toxic with acute renal failure.  Assessment/Plan:   Principal Problem:   Acute renal failure/Lithium toxicity with acute metabolic encephalopathy Hydrated and nephrology consulted for urgent HD. Baseline creatinine 0.93, admitted with a creatinine of 3.87. Renal ultrasound negative for obstruction. Lithium levels have stabilized. Mental status improving. Now out of restraints. HD catheter discontinued. Nephrology signed off. Advance diet. Transfer to floor.  Active Problems:   Bipolar disorder No evidence of salicylate or Tylenol ingestion. Usual medications remain on hold other than Seroquel. Psychiatry consulted for recommendations regarding safely resuming mood stabilizing medications. Has Haldol as needed for agitation. Home psychotropics include Lamictal, lithium, Seroquel, trazodone and Ambien.    History of polysubstance abuse including cocaine and alcohol UDS and alcohol screen negative on admission.    UTI Febrile with elevated WBC. Empiric Rocephin initiated. Follow-up cultures. Aspiration also in the differential. Chest x-ray clear on 12/09/16.    Hypertension Blood pressure elevated. Continue propranolol. Lisinopril remains on hold. Will resume given normalization of renal function and ongoing hypertension.    Chronic malunion ankle fracture Avoiding narcotics.    HIV screening Screened 12/09/16: Nonreactive.    Obesity Body mass index is 32.36 kg/m.   Family Communication/Anticipated D/C date  and plan/Code Status   DVT prophylaxis: Lovenox ordered. Code Status: Full Code.  Family Communication: Parents updated at the bedside. Disposition Plan: Home when cleared by psychiatry.   Medical Consultants:    Nephrology  Psychiatry   Anti-Infectives:    Rocephin 12/09/16--->   Subjective:   Patient says his appetite is poor but says he also does not like the food here. Seems to be confused. Has difficulty communicating his thoughts and is tangential.  Objective:    Vitals:   12/11/16 0757 12/11/16 0830 12/11/16 1134 12/11/16 1135  BP:  (!) 133/97  (!) 151/92  Pulse:  83  84  Resp:  (!) 24  20  Temp: 98.5 F (36.9 C) 98.4 F (36.9 C) 98.1 F (36.7 C)   TempSrc: Oral Oral Oral   SpO2:  93%  100%  Weight:      Height:        Intake/Output Summary (Last 24 hours) at 12/11/16 1515 Last data filed at 12/11/16 1400  Gross per 24 hour  Intake          4453.75 ml  Output              900 ml  Net          3553.75 ml   Filed Weights   12/09/16 2351  Weight: 99.4 kg (219 lb 2.2 oz)    Exam: General: No acute distress. Cardiovascular: Heart sounds show a regular rate, and rhythm. No gallops or rubs. No murmurs. No JVD. Lungs: Clear to auscultation bilaterally with good air movement. No rales, rhonchi or wheezes. Abdomen: Soft, nontender, nondistended with normal active bowel sounds. No masses. No hepatosplenomegaly. Neurological: Alert and oriented 2. Moves all extremities 4 with equal strength. Cranial nerves II through XII grossly intact. Skin:  Warm and dry. No rashes or lesions. Extremities: No clubbing or cyanosis. No edema. Pedal pulses 2+. Psychiatric: Mood and affect are depressed/flat, tangential thoughts. Insight and judgment are poor.   Data Reviewed:   I have personally reviewed following labs and imaging studies:  Labs: Labs show the following: Sodium 144, potassium 3.8, chloride 112, bicarbonate 25, BUN 21, creatinine 0.89, glucose 146,  phosphorus 1.8. WBC 9.9, hemoglobin 13, hematocrit 39.9, platelets 144.  Lithium level 0.81.  CK was 87. Hepatitis B studies were negative. HIV nonreactive. UDS negative.  Microbiology 12/09/16: MRSA PCR: Negative. 12/10/16: Blood culture left arm: No growth. 12/10/16: Blood culture right hand: No growth. 12/10/16: Urine culture: Greater than 100,000 colonies of Enterobacter.   Procedures and diagnostic studies:  12/08/16: CT head: No acute intracranial abnormality. 12/08/16: PCXR: Bibasilar atelectasis. 12/09/16: US Renal: Normal.  12/09/16: PCXR: Right IJ central line catheter mid SVC, no pneumothorax. 12/09/16: PCXR: My independent review of the image shows: Vascular congestion and left base atelectasis.   Medications:   . azelastine  2 spray Each Nare Daily  . chlorhexidine  15 mL Mouth Rinse BID  . enoxaparin (LOVENOX) injection  40 mg Subcutaneous Daily  . ketotifen  1 drop Both Eyes BID  . mouth rinse  15 mL Mouth Rinse q12n4p  . propranolol ER  60 mg Oral Daily  . QUEtiapine  200 mg Oral Daily  . sodium chloride flush  3 mL Intravenous Q12H   Continuous Infusions: . sodium chloride 50 mL/hr (12/11/16 1034)  . sodium chloride    . sodium chloride    . cefTRIAXone (ROCEPHIN)  IV Stopped (12/11/16 1433)   Level 3   LOS: 3 days   RAMA,CHRISTINA  [facility] Pager [phone]. If unable to reach me by pager, please call my cell phone at [phone].  *Please refer to [email or web address], password [email or web address] to get updated schedule on who will round on this patient, as hospitalists switch teams weekly. If 7PM-7AM, please contact night-coverage at [email or web address], password [email or web address] for any overnight needs.  12/11/2016, 3:15 PM

## 2016-12-11 NOTE — Progress Notes (Signed)
Subjective: Interval History: has complaints does not like restraints.  Objective: Vital signs in last 24 hours: Temp:  [98 F (36.7 C)-99.2 F (37.3 C)] 98.7 F (37.1 C) (08/22 0317) Pulse Rate:  [80-95] 80 (08/22 0707) Resp:  [16-30] 20 (08/22 0707) BP: (119-174)/(73-108) 166/108 (08/22 0317) SpO2:  [94 %-98 %] 94 % (08/22 0707) Weight change:   Intake/Output from previous day: 08/21 0701 - 08/22 0700 In: 4153.8 [P.O.:480; I.V.:3623.8; IV Piggyback:50] Out: 650 [Urine:650] Intake/Output this shift: No intake/output data recorded.  General appearance: alert, cooperative and Ox1 Neck: RIJ cath Resp: clear to auscultation bilaterally Cardio: S1, S2 normal and systolic murmur: holosystolic 2/6, blowing at apex GI: soft, non-tender; bowel sounds normal; no masses,  no organomegaly Extremities: extremities normal, atraumatic, no cyanosis or edema  Lab Results:  Recent Labs  12/09/16 0528 12/10/16 0504  WBC 14.7* 9.9  HGB 13.6 13.0  HCT 40.8 39.9  PLT 175 144*   BMET:  Recent Labs  12/10/16 0504 12/11/16 0514  NA 140 144  K 3.8 3.8  CL 108 112*  CO2 25 25  GLUCOSE 141* 146*  BUN 30* 21*  CREATININE 1.58* 0.89  CALCIUM 8.9 9.3   No results for input(s): PTH in the last 72 hours. Iron Studies: No results for input(s): IRON, TIBC, TRANSFERRIN, FERRITIN in the last 72 hours.  Studies/Results: Dg Chest Port 1 View  Result Date: 12/09/2016 CLINICAL DATA:  Acute respiratory distress. EXAM: PORTABLE CHEST 1 VIEW COMPARISON:  Earlier this day at 0229 hour FINDINGS: Tip of the right central line in the mid SVC. No pneumothorax. Low lung volumes again seen, similar to prior exam. Unchanged vascular congestion. Streaky left lung base atelectasis. Unchanged heart size and mediastinal contours. No pneumothorax. IMPRESSION: Persistent low lung volumes with vascular congestion. Unchanged left basilar atelectasis. No new abnormality. Electronically Signed   By: Jeb Levering  M.D.   On: 12/09/2016 23:49    I have reviewed the patient's current medications.  Assessment/Plan: 1 Li toxic . Level still falling with urine and is acceptable range, will cont to clear with urine.  No need for HD cath 2 Enceph residual acute illness vs other drug withdrawal 3 UTI P D/c HD cath,  Will s/o at this time and see again at your request    LOS: 3 days   Krystena Reitter L 12/11/2016,7:45 AM

## 2016-12-11 NOTE — Consult Note (Signed)
[facility] Consult   Reason for Consult: Bipolar disorder and status post unintentional lithium toxicity Referring Physician:  Dr. Rockne Menghini Patient Identification: Colin Kelley MRN:  [id number] Principal Diagnosis: Lithium toxicity Diagnosis:   Patient Active Problem List   Diagnosis Date Noted  . Acute metabolic encephalopathy [S93.73] 12/09/2016  . Acute renal failure (ARF) ([place]) [N17.9] 12/09/2016  . Lithium toxicity [T56.891A] 12/08/2016    Total Time spent with patient: 1 hour  Subjective:   Colin Kelley is a 56 y.o. male patient admitted with Lithium toxicity.  HPI:  Colin Kelley is a 56 y.o. male with medical history significant of polysubstance abuse; OSA; HTN; HLD; and bipolar disorder presenting with AMS.   Patient seen, chart reviewed for this face-to-face psychiatry consultation evaluation of bipolar disorder medication management and status post lithium toxicity. Patient is a poor historian and having difficulty remembering details about his medical problems, mental health problems and treatment needs. Patient repeatedly states give more time and need to think of what asked me and answer for it. Patient reported he has been staying with his mother and father and has a temporary job and has [facility]. Reportedly his medication lithium was changed from Lithobid to [place]. Patient sitting lithium levels has been trending down to therapeutic level at this time with his current treatment, including emergency hemodialysis and hydration. Patient has no family members at bedside. Patient endorses history of fusion cocaine in the past but denies current use of substance abuse.  ED Course:  Patient somnolent, follows limited commands, speech is slurred and intermittently unintelligible.  Patient fidgety.  Lithium level elevated at 2.29 and with new renal failure.  Pharmacy tech reports recent  medications changes, with a new rx for lithium at 450 mg dose - he previously took 300 mg qAM and 600 mg qhs.  Nephrology consulted and will do emergent HD.  Past Psychiatric History: Bipolar disorder and cocaine abuse. Patient has no previous acute psychiatric hospitalizations.  Risk to Self: Is patient at risk for suicide?: No Risk to Others:   Prior Inpatient Therapy:   Prior Outpatient Therapy:    Past Medical History:  Past Medical History:  Diagnosis Date  . Alcohol use disorder, severe, in controlled environment ([place])   . Allergic rhinitis   . Attention deficit disorder without hyperactivity   . Cocaine use disorder, severe, dependence ([place])   . 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 Dyslipidemia, goal LDL below 130   . Hypertension   . Hypogonadism male   . OSA (obstructive sleep apnea)   . Spinal stenosis of lumbar region without neurogenic claudication   . Spondylolisthesis   . Substance induced mood disorder [facility])     Past Surgical History:  Procedure Laterality Date  . prostectomy    . SHOULDER SURGERY     Family History: History reviewed. No pertinent family history. Family Psychiatric  History: unknown Social History:  History  Alcohol Use No     History  Drug Use No    Social History   Social History  . Marital status: Single    Spouse name: N/A  . Number of children: N/A  . Years of education: N/A   Social History Main Topics  . Smoking status: Former Smoker    Quit date: 09/13/2014  . Smokeless tobacco: Current User    Types: Chew  . Alcohol use No  . Drug use: No  . Sexual  activity: Not Asked   Other Topics Concern  . None   Social History Narrative   Admitted for altered mentation, lithium toxicity. Lives at home with parents.    Additional Social History:    Allergies:   Allergies  Allergen Reactions  . Shellfish-Derived Products Hives  . Fructose Nausea And Vomiting  . Sulfa Antibiotics Rash    Labs:   Results for orders placed or performed during the hospital encounter of 12/08/16 (from the past 48 hour(s))  Lithium level     Status: Abnormal   Collection Time: 12/09/16  1:59 PM  Result Value Ref Range   Lithium Lvl 1.39 (H) 0.60 - 1.20 mmol/L  Lithium level     Status: Abnormal   Collection Time: 12/09/16  4:41 PM  Result Value Ref Range   Lithium Lvl 1.48 (H) 0.60 - 1.20 mmol/L  Culture, blood (routine x 2)     Status: None (Preliminary result)   Collection Time: 12/10/16 12:36 AM  Result Value Ref Range   Specimen Description BLOOD LEFT ARM    Special Requests IN PEDIATRIC BOTTLE Blood Culture adequate volume    Culture NO GROWTH 1 DAY    Report Status PENDING   Culture, blood (routine x 2)     Status: None (Preliminary result)   Collection Time: 12/10/16 12:37 AM  Result Value Ref Range   Specimen Description BLOOD RIGHT HAND    Special Requests IN PEDIATRIC BOTTLE Blood Culture adequate volume    Culture NO GROWTH 1 DAY    Report Status PENDING   Urinalysis, Routine w reflex microscopic     Status: Abnormal   Collection Time: 12/10/16  1:00 AM  Result Value Ref Range   Color, Urine AMBER (A) YELLOW    Comment: BIOCHEMICALS MAY BE AFFECTED BY COLOR   APPearance TURBID (A) CLEAR   Specific Gravity, Urine 1.015 1.005 - 1.030   pH 6.0 5.0 - 8.0   Glucose, UA 50 (A) NEGATIVE mg/dL   Hgb urine dipstick SMALL (A) NEGATIVE   Bilirubin Urine NEGATIVE NEGATIVE   Ketones, ur 20 (A) NEGATIVE mg/dL   Protein, ur >=300 (A) NEGATIVE mg/dL   Nitrite NEGATIVE NEGATIVE   Leukocytes, UA LARGE (A) NEGATIVE   RBC / HPF 6-30 0 - 5 RBC/hpf   WBC, UA TOO NUMEROUS TO COUNT 0 - 5 WBC/hpf   Bacteria, UA MANY (A) NONE SEEN   Squamous Epithelial / LPF 0-5 (A) NONE SEEN   Mucus PRESENT   Urine Culture     Status: Abnormal (Preliminary result)   Collection Time: 12/10/16  1:00 AM  Result Value Ref Range   Specimen Description Urine    Special Requests NONE    Culture >=100,000 COLONIES/mL  ENTEROBACTER SPECIES (A)    Report Status PENDING   Lithium level     Status: Abnormal   Collection Time: 12/10/16  5:04 AM  Result Value Ref Range   Lithium Lvl 1.31 (H) 0.60 - 1.20 mmol/L  Comprehensive metabolic panel     Status: Abnormal   Collection Time: 12/10/16  5:04 AM  Result Value Ref Range   Sodium 140 135 - 145 mmol/L   Potassium 3.8 3.5 - 5.1 mmol/L   Chloride 108 101 - 111 mmol/L   CO2 25 22 - 32 mmol/L   Glucose, Bld 141 (H) 65 - 99 mg/dL   BUN 30 (H) 6 - 20 mg/dL   Creatinine, Ser 1.58 (H) 0.61 - 1.24 mg/dL    Comment: DELTA  CHECK NOTED   Calcium 8.9 8.9 - 10.3 mg/dL   Total Protein 5.7 (L) 6.5 - 8.1 g/dL   Albumin 3.2 (L) 3.5 - 5.0 g/dL   AST 11 (L) 15 - 41 U/L   ALT 12 (L) 17 - 63 U/L   Alkaline Phosphatase 65 38 - 126 U/L   Total Bilirubin 1.3 (H) 0.3 - 1.2 mg/dL   GFR calc non Af Amer 47 (L) >60 mL/min   GFR calc Af Amer 54 (L) >60 mL/min    Comment: (NOTE) The eGFR has been calculated using the CKD EPI equation. This calculation has not been validated in all clinical situations. eGFR's persistently <60 mL/min signify possible Chronic Kidney Disease.    Anion gap 7 5 - 15  CBC     Status: Abnormal   Collection Time: 12/10/16  5:04 AM  Result Value Ref Range   WBC 9.9 4.0 - 10.5 K/uL   RBC 4.36 4.22 - 5.81 MIL/uL   Hemoglobin 13.0 13.0 - 17.0 g/dL   HCT 39.9 39.0 - 52.0 %   MCV 91.5 78.0 - 100.0 fL   MCH 29.8 26.0 - 34.0 pg   MCHC 32.6 30.0 - 36.0 g/dL   RDW 13.8 11.5 - 15.5 %   Platelets 144 (L) 150 - 400 K/uL  Lithium level     Status: None   Collection Time: 12/10/16  4:48 PM  Result Value Ref Range   Lithium Lvl 1.07 0.60 - 1.20 mmol/L  Lithium level     Status: None   Collection Time: 12/11/16  5:14 AM  Result Value Ref Range   Lithium Lvl 0.81 0.60 - 1.20 mmol/L  Renal function panel     Status: Abnormal   Collection Time: 12/11/16  5:14 AM  Result Value Ref Range   Sodium 144 135 - 145 mmol/L   Potassium 3.8 3.5 - 5.1 mmol/L    Chloride 112 (H) 101 - 111 mmol/L   CO2 25 22 - 32 mmol/L   Glucose, Bld 146 (H) 65 - 99 mg/dL   BUN 21 (H) 6 - 20 mg/dL   Creatinine, Ser 0.89 0.61 - 1.24 mg/dL   Calcium 9.3 8.9 - 10.3 mg/dL   Phosphorus 1.8 (L) 2.5 - 4.6 mg/dL   Albumin 3.1 (L) 3.5 - 5.0 g/dL   GFR calc non Af Amer >60 >60 mL/min   GFR calc Af Amer >60 >60 mL/min    Comment: (NOTE) The eGFR has been calculated using the CKD EPI equation. This calculation has not been validated in all clinical situations. eGFR's persistently <60 mL/min signify possible Chronic Kidney Disease.    Anion gap 7 5 - 15    Current Facility-Administered Medications  Medication Dose Route Frequency Provider Last Rate Last Dose  . 0.9 %  sodium chloride infusion   Intravenous Continuous Mauricia Area, MD 50 mL/hr at 12/11/16 1034 50 mL/hr at 12/11/16 1034  . 0.9 %  sodium chloride infusion  100 mL Intravenous PRN Madelon Lips, MD      . 0.9 %  sodium chloride infusion  100 mL Intravenous PRN Madelon Lips, MD      . acetaminophen (TYLENOL) tablet 650 mg  650 mg Oral Q6H PRN Karmen Bongo, MD   650 mg at 12/09/16 2307   Or  . acetaminophen (TYLENOL) suppository 650 mg  650 mg Rectal Q6H PRN Karmen Bongo, MD   650 mg at 12/10/16 0747  . albuterol (PROVENTIL) (2.5 MG/3ML) 0.083% nebulizer solution 2.5 mg  2.5 mg Nebulization Q4H PRN Opyd, Ilene Qua, MD      . alteplase (CATHFLO ACTIVASE) injection 2 mg  2 mg Intracatheter Once PRN Madelon Lips, MD      . azelastine (ASTELIN) 0.1 % nasal spray 2 spray  2 spray Each Nare Daily Cherene Altes, MD   2 spray at 12/11/16 1027  . calcium carbonate (dosed in mg elemental calcium) suspension 500 mg of elemental calcium  500 mg of elemental calcium Oral Q6H PRN Karmen Bongo, MD      . camphor-menthol [facility]) lotion 1 application  1 application Topical F5D PRN Karmen Bongo, MD       And  . hydrOXYzine (ATARAX/VISTARIL) tablet 25 mg  25 mg Oral Q8H PRN Karmen Bongo, MD       . cefTRIAXone (ROCEPHIN) 1 g in dextrose 5 % 50 mL IVPB  1 g Intravenous Q24H Cherene Altes, MD   Stopped at 12/10/16 1210  . chlorhexidine (PERIDEX) 0.12 % solution 15 mL  15 mL Mouth Rinse BID Cherene Altes, MD   15 mL at 12/11/16 1027  . docusate sodium (ENEMEEZ) enema 283 mg  1 enema Rectal PRN Karmen Bongo, MD      . enoxaparin (LOVENOX) injection 40 mg  40 mg Subcutaneous Daily Karren Cobble, RPH   40 mg at 12/11/16 1029  . feeding supplement (NEPRO CARB STEADY) liquid 237 mL  237 mL Oral TID PRN Karmen Bongo, MD      . haloperidol lactate (HALDOL) injection 2 mg  2 mg Intravenous Q6H PRN Cherene Altes, MD   2 mg at 12/09/16 2120  . heparin injection 1,000 Units  1,000 Units Dialysis PRN Madelon Lips, MD      . ketotifen (ZADITOR) 0.025 % ophthalmic solution 1 drop  1 drop Both Eyes BID Cherene Altes, MD   1 drop at 12/11/16 1029  . lidocaine (PF) (XYLOCAINE) 1 % injection 5 mL  5 mL Intradermal PRN Madelon Lips, MD      . lidocaine-prilocaine (EMLA) cream 1 application  1 application Topical PRN Madelon Lips, MD      . MEDLINE mouth rinse  15 mL Mouth Rinse q12n4p Cherene Altes, MD   15 mL at 12/11/16 1232  . ondansetron (ZOFRAN) tablet 4 mg  4 mg Oral Q6H PRN Karmen Bongo, MD       Or  . ondansetron [facility]) injection 4 mg  4 mg Intravenous Q6H PRN Karmen Bongo, MD      . pentafluoroprop-tetrafluoroeth Landry Dyke) aerosol 1 application  1 application Topical PRN Madelon Lips, MD      . propranolol ER (INDERAL LA) 24 hr capsule 60 mg  60 mg Oral Daily Cherene Altes, MD   60 mg at 12/11/16 1028  . QUEtiapine (SEROQUEL XR) 24 hr tablet 200 mg  200 mg Oral Daily Cherene Altes, MD   200 mg at 12/11/16 1028  . sodium chloride flush (NS) 0.9 % injection 3 mL  3 mL Intravenous Q12H Karmen Bongo, MD   3 mL at 12/10/16 2201  . sorbitol 70 % solution 30 mL  30 mL Oral PRN Karmen Bongo, MD      . zolpidem Lorrin Mais) tablet 5 mg   5 mg Oral QHS PRN Karmen Bongo, MD        Musculoskeletal: Strength & Muscle Tone: decreased Gait & Station: unable to stand Patient leans: N/A  Psychiatric Specialty Exam: Physical Exam as per history and physical  ROS patient has better mental status compared with the presentation with altered mental status but continued to be confused and not able to remember regarding his conditions and treatment needs. Patient denied nausea, vomiting, abdominal pain, shortness of breath and chest pain.  No Fever-chills, No Headache, No changes with Vision or hearing, reports vertigo No problems swallowing food or Liquids, No Chest pain, Cough or Shortness of Breath, No Abdominal pain, No Nausea or Vommitting, Bowel movements are regular, No Blood in stool or Urine, No dysuria, No new skin rashes or bruises, No new joints pains-aches,  No new weakness, tingling, numbness in any extremity, No recent weight gain or loss, No polyuria, polydypsia or polyphagia,  A full 10 point Review of Systems was done, except as stated above, all other Review of Systems were negative.  Blood pressure (!) 151/92, pulse 84, temperature 98.1 F (36.7 C), temperature source Oral, resp. rate 20, height _0  (1.753 m), weight 99.4 kg (219 lb 2.2 oz), SpO2 100 %.Body mass index is 32.36 kg/m.  General Appearance: Guarded  Eye Contact:  Good  Speech:  Blocked  Volume:  Decreased  Mood:  Anxious and Depressed  Affect:  Congruent and Depressed  Thought Process:  Coherent and Goal Directed  Orientation:  Full (Time, Place, and Person)  Thought Content:  Rumination and Tangential  Suicidal Thoughts:  No  Homicidal Thoughts:  No  Memory:  Immediate;   Fair Recent;   Poor Remote;   Fair  Judgement:  Fair  Insight:  Fair  Psychomotor Activity:  Decreased  Concentration:  Concentration: Fair and Attention Span: Poor  Recall:  Poor  Fund of Knowledge:  Fair  Language:  Good  Akathisia:  Negative  Handed:   Right  AIMS (if indicated):     Assets:  Communication Skills Desire for Improvement Financial Resources/Insurance Housing Leisure Time Resilience Social Support Transportation  ADL's:  Impaired  Cognition:  Impaired,  Mild  Sleep:        Treatment Plan Summary:  Patient with history of bipolar disorder, cocaine abuse presented with an intentional lithium toxicity. Patient is a poor historian at this time and at the same time showing improvement in his mental status since completed emergency hemodialysis and being hydrated. Patient has no longer have toxic level of lithium but continued to have cognitive deficits especially regarding his memory and thought process. Patient has no suicidal or homicidal ideation and has no evidence of psychosis.  Bipolar disorder most recent episode unspecified Status post lithium toxicity Cocaine abuse by history  Recommendation: Discontinue lithium secondary to recent unintentional overdose Restart lower doses of gabapentin 400 mg twice daily, lamotrigine 100 mg daily for bipolar mood swings and trazodone 50 mg at bedtime for sleep as needed Increase Seroquel XR 300 mg daily for bipolar mood swings   Patient will be referred to the outpatient medication management when [facility].  Disposition: No evidence of imminent risk to self or others at present.   Supportive therapy provided about ongoing stressors.  Ambrose Finland, MD 12/11/2016 1:17 PM

## 2016-12-12 DIAGNOSIS — T56891A Toxic effect of other metals, accidental (unintentional), initial encounter: Secondary | ICD-10-CM

## 2016-12-12 LAB — RENAL FUNCTION PANEL
ANION GAP: 6 (ref 5–15)
Albumin: 2.8 g/dL — ABNORMAL LOW (ref 3.5–5.0)
BUN: 16 mg/dL (ref 6–20)
CALCIUM: 9.2 mg/dL (ref 8.9–10.3)
CO2: 25 mmol/L (ref 22–32)
Chloride: 111 mmol/L (ref 101–111)
Creatinine, Ser: 0.78 mg/dL (ref 0.61–1.24)
GFR calc Af Amer: >60 mL/min (ref >60)
GFR calc non Af Amer: >60 mL/min (ref >60)
GLUCOSE: 103 mg/dL — AB (ref 65–99)
POTASSIUM: 3.4 mmol/L — AB (ref 3.5–5.1)
Phosphorus: 2.5 mg/dL (ref 2.5–4.6)
Sodium: 142 mmol/L (ref 135–145)

## 2016-12-12 LAB — URINE CULTURE: Culture: >=100000 — AB

## 2016-12-12 LAB — LITHIUM LEVEL: Lithium Lvl: 0.62 mmol/L (ref 0.60–1.20)

## 2016-12-12 MED ORDER — POTASSIUM CHLORIDE CRYS ER 20 MEQ PO TBCR
40.0000 meq | EXTENDED_RELEASE_TABLET | Freq: Once | ORAL | Status: AC
  Administered 2016-12-12: 40 meq via ORAL
  Filled 2016-12-12: qty 2

## 2016-12-12 MED ORDER — TRAZODONE HCL 50 MG PO TABS: 50.0000 mg | ORAL_TABLET | Freq: Every evening | ORAL | 1 refills | Status: DC | PRN

## 2016-12-12 MED ORDER — GABAPENTIN 400 MG PO CAPS: 400.0000 mg | ORAL_CAPSULE | Freq: Two times a day (BID) | ORAL | 0 refills | Status: DC

## 2016-12-12 MED ORDER — LAMOTRIGINE 100 MG PO TABS: 100.0000 mg | ORAL_TABLET | Freq: Every day | ORAL | 0 refills | Status: DC

## 2016-12-12 NOTE — Care Management Note (Signed)
Case Management Note  Patient Details  Name: Colin Kelley MRN: [id number] Date of Birth: [birth year]  Subjective/Objective:      CM following for progression and d/c planning.              Action/Plan: 12/12/2016 Pt for d/c to home with parents, HHPT recommended, discussed with pt and family . [facility] selected as [facility] provider for HHPT per PT eval and recommendation. [facility] notified of pt needs. Per pt and family they have several walkers at home and the pt does not wish to have another walker.   Expected Discharge Date:  12/12/16               Expected Discharge Plan:  [place]  In-House Referral:  NA  Discharge planning Services  CM Consult  Post Acute Care Choice:  Home Health Choice offered to:  Patient  DME Arranged:  N/A per pt and family they have several walkers.No further DME needs.  DME Agency:  NA  HH Arranged:  [place] Agency:  [facility] (now [facility])  Status of Service:  Completed, signed off  If discussed at [place] of Stay Meetings, dates discussed:    Additional Comments:  Adron Bene, RN 12/12/2016, 3:34 PM

## 2016-12-12 NOTE — Evaluation (Signed)
Physical Therapy Evaluation Patient Details Name: Colin Kelley MRN: [id number] DOB: [birth year] Today's Date: 12/12/2016   History of Present Illness  Pt is a 56 y/o male admitted secondary to lithium toxicity and AMS. Pt also with acute renal failure. PMH includes polysubstance abuse, HTN, alcohol abuse, and bipolar disorder.   Clinical Impression  Pt admitted secondary to problem above with deficits below. PTA, pt was independent with functional mobility and reports he used RW sometimes. Unsure if this is correct secondary to AMS. Upon eval, pt presenting with cognitive deficits and decreased balance. Pt requiring min guard to min A for functional mobility secondary to decreased balance and required multiple safety cues for safe use of RW. Unsure of current living environment, as pt reporting he lives in apartment with roommate. Per notes, pt from home with parents. Will need to ensure 24/7 assist available at d/c to ensure safety at home. If unable to provide 24/7 will need SNF at d/c. Recommending OT consult to address visual deficits and to determine safety with ADLs. Will continue to follow acutely to maximize functional mobility independence and safety and update recommendations as necessary.     Follow Up Recommendations Home health PT;Supervision/Assistance - 24 hour (HHOT; Will need SNF if unable to provide 24/7)    Equipment Recommendations  Rolling walker with 5" wheels;3in1 (PT)    Recommendations for Other Services OT consult     Precautions / Restrictions Precautions Precautions: Fall Restrictions Weight Bearing Restrictions: No      Mobility  Bed Mobility Overal bed mobility: Modified Independent             General bed mobility comments: Increased time, but no assist required.   Transfers Overall transfer level: Needs assistance Equipment used: Rolling walker (2 wheeled) Transfers: Sit to/from Stand Sit to Stand: Min guard         General transfer  comment: Min guard for safety. Verbal cues for safe hand placement.   Ambulation/Gait Ambulation/Gait assistance: Min assist;Min guard Ambulation Distance (Feet): 75 Feet Assistive device: Rolling walker (2 wheeled) Gait Pattern/deviations: Step-through pattern;Decreased stride length;Drifts right/left;Leaning posteriorly Gait velocity: Decreased Gait velocity interpretation: Below normal speed for age/gender General Gait Details: Slow, unsteady gait. Constant min guard with occasional min A secondary to decreased balance and safety with use of RW. Required verbal cues, especially during turns to ensure appropriate use of RW.   Stairs            Wheelchair Mobility    Modified Rankin (Stroke Patients Only)       Balance Overall balance assessment: Needs assistance Sitting-balance support: No upper extremity supported;Feet supported Sitting balance-Leahy Scale: Good     Standing balance support: Bilateral upper extremity supported;During functional activity Standing balance-Leahy Scale: Poor Standing balance comment: Reliant on RW for stability. Posterior lean X 1 when turning with RW.                              Pertinent Vitals/Pain Pain Assessment: No/denies pain    Home Living Family/patient expects to be discharged to:: Private residence Living Arrangements: Other (Comment) (roomate ) Available Help at Discharge: Friend(s);Available PRN/intermittently Type of Home: Apartment Home Access: Stairs to enter Entrance Stairs-Rails: Right;Left Entrance Stairs-Number of Steps: 5 Home Layout: One level Home Equipment: Walker - 2 wheels;Shower seat Additional Comments: Unsure if any of this information is correct secondary to AMS. Per RN and notes, pt from home with parents, however, did  not report he lived with his parents.     Prior Function Level of Independence: Independent with assistive device(s)         Comments: Pt reports he doesn't use his RW  much.      Hand Dominance   Dominant Hand: Right    Extremity/Trunk Assessment   Upper Extremity Assessment Upper Extremity Assessment: Defer to OT evaluation    Lower Extremity Assessment Lower Extremity Assessment: Overall WFL for tasks assessed    Cervical / Trunk Assessment Cervical / Trunk Assessment: Normal  Communication   Communication: No difficulties  Cognition Arousal/Alertness: Suspect due to medications (falling asleep during history questions. ) Behavior During Therapy: WFL for tasks assessed/performed Overall Cognitive Status: No family/caregiver present to determine baseline cognitive functioning Area of Impairment: Orientation;Memory;Awareness;Safety/judgement;Problem solving                 Orientation Level: Disoriented to;Place;Situation   Memory: Decreased short-term memory;Decreased recall of precautions   Safety/Judgement: Decreased awareness of safety;Decreased awareness of deficits Awareness: Emergent Problem Solving: Slow processing;Requires verbal cues;Requires tactile cues General Comments: Pt with poor awareness of situation and reports he went to the doctor this morning. Pt also with slowed processing and decreased STM. Reports he lived with roomate, however, per notes, pt lived at home with parents.       General Comments General comments (skin integrity, edema, etc.): No family present to determine baseline functioning. Pt wanting to go home and will need 24/7 supervision and HHPT, HHOT to ensure safety at home. If unable to have 24/7, will need SNF to ensure safety at d/c. Reporting blurry vision as well, so recommending OT consult to assess vision.     Exercises     Assessment/Plan    PT Assessment Patient needs continued PT services  PT Problem List Decreased balance;Decreased mobility;Decreased cognition;Decreased knowledge of use of DME;Decreased safety awareness;Decreased knowledge of precautions       PT Treatment  Interventions DME instruction;Gait training;Stair training;Functional mobility training;Therapeutic activities;Therapeutic exercise;Balance training;Neuromuscular re-education;Patient/family education    PT Goals (Current goals can be found in the Care Plan section)  Acute Rehab PT Goals Patient Stated Goal: to go home  PT Goal Formulation: With patient Time For Goal Achievement: 12/26/16 Potential to Achieve Goals: Fair    Frequency Min 3X/week   Barriers to discharge Other (comment) Unsure of caregiver support     Co-evaluation               AM-PAC PT "6 Clicks" Daily Activity  Outcome Measure Difficulty turning over in bed (including adjusting bedclothes, sheets and blankets)?: A Little Difficulty moving from lying on back to sitting on the side of the bed? : A Little Difficulty sitting down on and standing up from a chair with arms (e.g., wheelchair, bedside commode, etc,.)?: Unable Help needed moving to and from a bed to chair (including a wheelchair)?: A Little Help needed walking in hospital room?: A Little Help needed climbing 3-5 steps with a railing? : A Lot 6 Click Score: 15    End of Session Equipment Utilized During Treatment: Gait belt Activity Tolerance: Patient tolerated treatment well Patient left: in bed;with call bell/phone within reach;with bed alarm set Nurse Communication: Mobility status PT Visit Diagnosis: Unsteadiness on feet (R26.81)    Time: 1356-1420 PT Time Calculation (min) (ACUTE ONLY): 24 min   Charges:   PT Evaluation $PT Eval Moderate Complexity: 1 Mod PT Treatments $Gait Training: 8-22 mins   PT G Codes:  Leighton Ruff, PT, DPT  Acute Rehabilitation Services  Pager: [phone]   Rudean Hitt 12/12/2016, 2:32 PM

## 2016-12-12 NOTE — Discharge Summary (Addendum)
Physician Discharge Summary  [name] NLG:[id number] DOB: [birth year] DOA: 12/08/2016  PCP: Ann Held, MD  Admit date: 12/08/2016 Discharge date: 12/12/2016  Admitted From: Home Discharge disposition: Home   Recommendations for Outpatient Follow-Up:   1. The patient will F/U with his psychiatrist within 1 week post discharge.   Discharge Diagnosis:   Principal Problem:   Lithium toxicity Active Problems:   Acute metabolic encephalopathy   Acute renal failure (ARF) (HCC)   Pressure injury of skin   Chronic left trimalleolar fracture   Bipolar disorder   H/O polysubstance abuse   Enterobacter UTI   Obesity   Hypokalemia   Stage I pressure sore bridge of nose from bipap mask   Discharge Condition: Improved.  Diet recommendation: Low sodium, heart healthy.    History of Present Illness:   Colin Kelley is an 56 y.o. male with a PMH of polysubstance abuse, OSA, hypertension, hyperlipidemia and bipolar disorder who was admitted 12/08/16 with mental status changes/somnolence and unsteadiness on feet. Upon evaluation, he was found to be lithium toxic with acute renal failure.   Hospital Course by Problem:   Principal Problem:   Acute renal failure/Lithium toxicity with acute metabolic encephalopathy Hydrated and nephrology consulted for urgent HD. Baseline creatinine 0.93, admitted with a creatinine of 3.87. Renal ultrasound negative for obstruction. . Mental status improved and was able to come out of restraints 12/11/16. HD catheter discontinued. Nephrology signed off.   Active Problems:   Bipolar disorder No evidence of salicylate or Tylenol ingestion. Home psychotropics include Lamictal, lithium, Seroquel, trazodone and Ambien. Evaluated by psychiatrist 12/11/16 who recommended discontinuation of lithium, restarting gabapentin at a lower dose, and continuing Lamictal and trazodone. Seroquel was resumed.    History of polysubstance abuse including cocaine and  alcohol UDS and alcohol screen negative on admission.    UTI Urine cultures positive for Enterobacter, speciation pending. Treated with Rocephin.     Hypertension Blood pressure improved with resumption of propranolol and lisinopril.     Chronic malunion left ankle fracture Avoided giving him narcotics secondary to encephalopathy.    HIV screening Screened 12/09/16: Nonreactive.    Obesity Body mass index is 32.3 kg/m. Weight loss encouraged.    Hypokalemia Given 40 mEq of oral potassium prior to discharge.    Stage I pressure sore bridge of nose from bipap mask Resolved.    Medical Consultants:    Nephrology  Psychiatry   Discharge Exam:   Vitals:   12/11/16 2041 12/12/16 0900  BP: 138/82 (!) 158/54  Pulse: 82 95  Resp: (!) 21 (!) 22  Temp: 99.2 F (37.3 C) 98.6 F (37 C)  SpO2: 98% 98%   Vitals:   12/11/16 1135 12/11/16 1525 12/11/16 2041 12/12/16 0900  BP: (!) 151/92 (!) 141/91 138/82 (!) 158/54  Pulse: 84 79 82 95  Resp: 20 (!) 22 (!) 21 (!) 22  Temp:  99.3 F (37.4 C) 99.2 F (37.3 C) 98.6 F (37 C)  TempSrc:  Oral Oral Oral  SpO2: 100% 96% 98% 98%  Weight:   99.2 kg (218 lb 11.1 oz)   Height:        General exam: Appears calm and comfortable.  Respiratory system: Clear to auscultation. Respiratory effort normal. Cardiovascular system: S1 & S2 heard, RRR. No JVD,  rubs, gallops or clicks. No murmurs. Gastrointestinal system: Abdomen is nondistended, soft and nontender. No organomegaly or masses felt. Normal bowel sounds heard. Central nervous system: Alert and oriented. No focal  neurological deficits. Extremities: No clubbing,  or cyanosis. No edema. Skin: No rashes, lesions or ulcers. Psychiatry: Judgement and insight appear impairedl. Mood & affect mildly anxious.    The results of significant diagnostics from this hospitalization (including imaging, microbiology, ancillary and laboratory) are listed below for reference.      Procedures and Diagnostic Studies:   Ct Head Wo Contrast  Result Date: 12/08/2016 CLINICAL DATA:  Possible overdose.  Slurred speech. EXAM: CT HEAD WITHOUT CONTRAST TECHNIQUE: Contiguous axial images were obtained from the base of the skull through the vertex without intravenous contrast. COMPARISON:  None. FINDINGS: Brain: No evidence of acute infarction, hemorrhage, hydrocephalus, extra-axial collection or mass lesion/mass effect. Vascular: No hyperdense vessel or unexpected calcification. Skull: Normal. Negative for fracture or focal lesion. Sinuses/Orbits: Mild polypoid mucosal thickening of the maxillary sinuses. Postsurgical changes in the maxillary sinuses. Other: None. IMPRESSION: No acute intracranial abnormality. Electronically Signed   By: Fidela Salisbury M.D.   On: 12/08/2016 22:27   US Renal  Result Date: 12/09/2016 CLINICAL DATA:  Acute kidney injury. History of prostatectomy, hypertension and hyperlipidemia. EXAM: RENAL / URINARY TRACT ULTRASOUND COMPLETE COMPARISON:  None. FINDINGS: Right Kidney: Length: 13.7 cm. Echogenicity within normal limits. No mass or hydronephrosis visualized. Left Kidney: Length: 12.9 cm. Echogenicity within normal limits. No mass or hydronephrosis visualized. Bladder: Mildly echogenic layering debris within urinary bladder. IMPRESSION: Normal renal ultrasound. Debris within bladder.  Recommend correlation with urinary analysis. Electronically Signed   By: Elon Alas M.D.   On: 12/09/2016 03:03   Dg Chest Port 1 View  Result Date: 12/09/2016 CLINICAL DATA:  Acute respiratory distress. EXAM: PORTABLE CHEST 1 VIEW COMPARISON:  Earlier this day at 0229 hour FINDINGS: Tip of the right central line in the mid SVC. No pneumothorax. Low lung volumes again seen, similar to prior exam. Unchanged vascular congestion. Streaky left lung base atelectasis. Unchanged heart size and mediastinal contours. No pneumothorax. IMPRESSION: Persistent low lung volumes  with vascular congestion. Unchanged left basilar atelectasis. No new abnormality. Electronically Signed   By: Jeb Levering M.D.   On: 12/09/2016 23:49   Dg Chest Port 1 View  Result Date: 12/09/2016 CLINICAL DATA:  Central line placement EXAM: PORTABLE CHEST 1 VIEW COMPARISON:  None. FINDINGS: The tip of a right IJ central line is seen in the mid SVC. No pneumothorax. Low lung volumes with borderline cardiomegaly. No aortic aneurysm. Mild pulmonary venous congestion. Left lower lobe subsegmental atelectasis. IMPRESSION: 1. Right IJ central line catheter tip is seen in the mid SVC level. No pneumothorax. 2. Low lung volumes are again noted with mild vascular congestion and borderline cardiomegaly. Electronically Signed   By: Ashley Royalty M.D.   On: 12/09/2016 02:41   Dg Chest Port 1 View  Result Date: 12/08/2016 CLINICAL DATA:  Altered mental status. EXAM: PORTABLE CHEST 1 VIEW COMPARISON:  Frontal and lateral views 04/21/2008 FINDINGS: Very low lung volumes limit assessment. Probable bibasilar atelectasis. Heart appears prominent in size, likely accentuated by technique. No confluent airspace disease. No large pleural effusion or pneumothorax. Osseous structures are grossly intact. IMPRESSION: Low lung volumes limiting assessment. Bibasilar atelectasis with prominent heart size likely accentuated by portable technique and low lung volumes. Recommend follow-up PA and lateral views based on clinical concern. Electronically Signed   By: Jeb Levering M.D.   On: 12/08/2016 23:10     Labs:   Basic Metabolic Panel:  Recent Labs Lab 12/08/16 2132 12/09/16 0504 12/10/16 0504 12/11/16 0514 12/12/16 0451  NA 137  138 140 144 142  K 3.9 3.8 3.8 3.8 3.4*  CL 104 108 108 112* 111  CO2 20* 21* 25 25 25   GLUCOSE 85 116* 141* 146* 103*  BUN 54* 48* 30* 21* 16  CREATININE 3.87* 2.61* 1.58* 0.89 0.78  CALCIUM 9.0 9.0 8.9 9.3 9.2  PHOS  --   --   --  1.8* 2.5   GFR Estimated Creatinine Clearance:  118.3 mL/min (by C-G formula based on SCr of 0.78 mg/dL). Liver Function Tests:  Recent Labs Lab 12/08/16 2132 12/10/16 0504 12/11/16 0514 12/12/16 0451  AST 14* 11*  --   --   ALT 13* 12*  --   --   ALKPHOS 64 65  --   --   BILITOT 1.4* 1.3*  --   --   PROT 6.3* 5.7*  --   --   ALBUMIN 4.1 3.2* 3.1* 2.8*   No results for input(s): LIPASE, AMYLASE in the last 168 hours. No results for input(s): AMMONIA in the last 168 hours. Coagulation profile No results for input(s): INR, PROTIME in the last 168 hours.  CBC:  Recent Labs Lab 12/08/16 2132 12/09/16 0034 12/09/16 0528 12/10/16 0504  WBC 19.4* 16.7* 14.7* 9.9  NEUTROABS 17.2*  --   --   --   HGB 14.0 13.3 13.6 13.0  HCT 42.4 40.3 40.8 39.9  MCV 92.0 91.6 91.3 91.5  PLT 178 166 175 144*   Cardiac Enzymes:  Recent Labs Lab 12/08/16 2132  CKTOTAL 87   BNP: Invalid input(s): POCBNP CBG:  Recent Labs Lab 12/08/16 2140 12/08/16 2142 12/08/16 2235  GLUCAP <10*  <10* 49* 175*   D-Dimer No results for input(s): DDIMER in the last 72 hours. Hgb A1c No results for input(s): HGBA1C in the last 72 hours. Lipid Profile No results for input(s): CHOL, HDL, LDLCALC, TRIG, CHOLHDL, LDLDIRECT in the last 72 hours. Thyroid function studies No results for input(s): TSH, T4TOTAL, T3FREE, THYROIDAB in the last 72 hours.  Invalid input(s): FREET3 Anemia work up No results for input(s): VITAMINB12, FOLATE, FERRITIN, TIBC, IRON, RETICCTPCT in the last 72 hours. Microbiology Recent Results (from the past 240 hour(s))  MRSA PCR Screening     Status: None   Collection Time: 12/09/16  9:37 AM  Result Value Ref Range Status   MRSA by PCR NEGATIVE NEGATIVE Final    Comment:        The GeneXpert MRSA Assay (FDA approved for NASAL specimens only), is one component of a comprehensive MRSA colonization surveillance program. It is not intended to diagnose MRSA infection nor to guide or monitor treatment for MRSA  infections.   Culture, blood (routine x 2)     Status: None (Preliminary result)   Collection Time: 12/10/16 12:36 AM  Result Value Ref Range Status   Specimen Description BLOOD LEFT ARM  Final   Special Requests IN PEDIATRIC BOTTLE Blood Culture adequate volume  Final   Culture NO GROWTH 2 DAYS  Final   Report Status PENDING  Incomplete  Culture, blood (routine x 2)     Status: None (Preliminary result)   Collection Time: 12/10/16 12:37 AM  Result Value Ref Range Status   Specimen Description BLOOD RIGHT HAND  Final   Special Requests IN PEDIATRIC BOTTLE Blood Culture adequate volume  Final   Culture NO GROWTH 2 DAYS  Final   Report Status PENDING  Incomplete  Urine Culture     Status: Abnormal   Collection Time: 12/10/16  1:00  AM  Result Value Ref Range Status   Specimen Description Urine  Final   Special Requests NONE  Final   Culture >=100,000 COLONIES/mL ENTEROBACTER SPECIES (A)  Final   Report Status 12/12/2016 FINAL  Final   Organism ID, Bacteria ENTEROBACTER SPECIES (A)  Final      Susceptibility   Enterobacter species - MIC*    CEFAZOLIN >=64 RESISTANT Resistant     CEFEPIME <=1 SENSITIVE Sensitive     CEFTAZIDIME <=1 SENSITIVE Sensitive     CEFTRIAXONE <=1 SENSITIVE Sensitive     CIPROFLOXACIN <=0.25 SENSITIVE Sensitive     GENTAMICIN <=1 SENSITIVE Sensitive     IMIPENEM 2 SENSITIVE Sensitive     TRIMETH/SULFA <=20 SENSITIVE Sensitive     PIP/TAZO <=4 SENSITIVE Sensitive     * >=100,000 COLONIES/mL ENTEROBACTER SPECIES     Discharge Instructions:   Discharge Instructions    Call MD for:  extreme fatigue    Complete by:  As directed    Call MD for:  persistant dizziness or light-headedness    Complete by:  As directed    Call MD for:  persistant nausea and vomiting    Complete by:  As directed    Diet - low sodium heart healthy    Complete by:  As directed    Discharge instructions    Complete by:  As directed    STOP Lithium.  Your doses of Lamictal,  neurontin and trazodone have been reduced temporarily.  You will follow up with Dr. Barbie Banner closely and he will make further recommendations about the doses of these medications when he sees you in follow up.   Increase activity slowly    Complete by:  As directed      Allergies as of 12/12/2016      Reactions   Shellfish-derived Products Hives   Fructose Nausea And Vomiting   Sulfa Antibiotics Rash      Medication List    STOP taking these medications   gabapentin 800 MG tablet Commonly known as:  NEURONTIN Replaced by:  gabapentin 400 MG capsule   lithium carbonate 300 MG CR tablet Commonly known as:  LITHOBID   lithium carbonate 450 MG CR tablet Commonly known as:  ESKALITH   meloxicam 7.5 MG tablet Commonly known as:  MOBIC   traMADol 50 MG tablet Commonly known as:  ULTRAM     TAKE these medications   aspirin EC 81 MG tablet Take 81 mg by mouth daily.   atorvastatin 20 MG tablet Commonly known as:  LIPITOR Take 20 mg by mouth daily.   azelastine 0.05 % ophthalmic solution Commonly known as:  OPTIVAR Place 2 drops into both eyes daily.   azelastine 0.1 % nasal spray Commonly known as:  ASTELIN Place 2 sprays into both nostrils daily.   BREO ELLIPTA 100-25 MCG/INH Aepb Generic drug:  fluticasone furoate-vilanterol Inhale 1 puff into the lungs daily.   cetirizine 10 MG tablet Commonly known as:  ZYRTEC Take 10 mg by mouth daily as needed for allergies.   fluticasone 50 MCG/ACT nasal spray Commonly known as:  FLONASE Place 1 spray into both nostrils daily as needed for allergies or rhinitis.   gabapentin 400 MG capsule Commonly known as:  NEURONTIN Take 1 capsule (400 mg total) by mouth 2 (two) times daily. Replaces:  gabapentin 800 MG tablet   HYDROcodone-acetaminophen 5-325 MG tablet Commonly known as:  NORCO/VICODIN Take 1 tablet by mouth every 4 (four) hours as needed.   lamoTRIgine  100 MG tablet Commonly known as:  LAMICTAL Take 1 tablet (100  mg total) by mouth daily. What changed:  medication strength  how much to take   lisinopril 40 MG tablet Commonly known as:  PRINIVIL,ZESTRIL Take 40 mg by mouth daily.   PROAIR HFA 108 (90 Base) MCG/ACT inhaler Generic drug:  albuterol Inhale 2 puffs into the lungs every 6 (six) hours as needed. Shortness of breath   propranolol ER 60 MG 24 hr capsule Commonly known as:  INDERAL LA Take 60 mg by mouth daily.   QUEtiapine 300 MG 24 hr tablet Commonly known as:  SEROQUEL XR Take 300 mg by mouth daily.   tadalafil 20 MG tablet Commonly known as:  CIALIS Take 20 mg by mouth daily as needed for erectile dysfunction.   traZODone 50 MG tablet Commonly known as:  DESYREL Take 1 tablet (50 mg total) by mouth at bedtime as needed for sleep. What changed:  how much to take   zolpidem 10 MG tablet Commonly known as:  AMBIEN Take 10 mg by mouth at bedtime as needed for sleep.            Discharge Care Instructions        Start     Ordered   12/12/16 0000  gabapentin (NEURONTIN) 400 MG capsule  2 times daily     12/12/16 1021   12/12/16 0000  lamoTRIgine (LAMICTAL) 100 MG tablet  Daily     12/12/16 1021   12/12/16 0000  traZODone (DESYREL) 50 MG tablet  At bedtime PRN     12/12/16 1021   12/12/16 0000  Increase activity slowly     12/12/16 1021   12/12/16 0000  Diet - low sodium heart healthy     12/12/16 1021   12/12/16 0000  Discharge instructions    Comments:  STOP Lithium.  Your doses of Lamictal, neurontin and trazodone have been reduced temporarily.  You will follow up with Dr. Barbie Banner closely and he will make further recommendations about the doses of these medications when he sees you in follow up.   12/12/16 1021   12/12/16 0000  Call MD for:  persistant nausea and vomiting     12/12/16 1021   12/12/16 0000  Call MD for:  persistant dizziness or light-headedness     12/12/16 1021   12/12/16 0000  Call MD for:  extreme fatigue     12/12/16 1021      Follow-up Information    Hollar, Larina Earthly, MD. Schedule an appointment as soon as possible for a visit in 1 week(s).   Specialty:  Family Medicine Why:  Hospital follow up. Contact information: [place] [address] [phone]        Eliseo Gum, MD. Schedule an appointment as soon as possible for a visit in 1 week(s).   Specialty:  Psychiatry Why:  For hospital follow up. Contact information: [place] [address] [phone]            Time coordinating discharge: 35 minutes.  Signed:  RAMA,CHRISTINA  Pager [phone] [facility] 12/12/2016, 10:25 PM

## 2016-12-12 NOTE — Progress Notes (Signed)
Pt discharged home in stable condition with his parents. Home needs set up by case management

## 2016-12-12 NOTE — Discharge Instructions (Signed)
Acute Kidney Injury, Adult Acute kidney injury is a sudden worsening of kidney function. The kidneys are organs that have several jobs. They filter the blood to remove waste products and extra fluid. They also maintain a healthy balance of minerals and hormones in the body, which helps control blood pressure and keep bones strong. With this condition, your kidneys do not do their jobs as well as they should. This condition ranges from mild to severe. Over time it may develop into long-lasting (chronic) kidney disease. Early detection and treatment may prevent acute kidney injury from developing into a chronic condition. What are the causes? Common causes of this condition include:  A problem with blood flow to the kidneys. This may be caused by:  Low blood pressure (hypotension) or shock.  Blood loss.  Heart and blood vessel (cardiovascular) disease.  Severe burns.  Liver disease.  Direct damage to the kidneys. This may be caused by:  Certain medicines.  A kidney infection.  Poisoning.  Being around or in contact with toxic substances.  A surgical wound.  A hard, direct hit to the kidney area.  A sudden blockage of urine flow. This may be caused by:  Cancer.  Kidney stones.  An enlarged prostate in males. What are the signs or symptoms? Symptoms of this condition may not be obvious until the condition becomes severe. Symptoms of this condition can include:  Tiredness (lethargy), or difficulty staying awake.  Nausea or vomiting.  Swelling (edema) of the face, legs, ankles, or feet.  Problems with urination, such as:  Abdominal pain, or pain along the side of your stomach (flank).  Decreased urine production.  Decrease in the force of urine flow.  Muscle twitches and cramps, especially in the legs.  Confusion or trouble concentrating.  Loss of appetite.  Fever. How is this diagnosed? This condition may be diagnosed with tests, including:  Blood  tests.  Urine tests.  Imaging tests.  A test in which a sample of tissue is removed from the kidneys to be examined under a microscope (kidney biopsy). How is this treated? Treatment for this condition depends on the cause and how severe the condition is. In mild cases, treatment may not be needed. The kidneys may heal on their own. In more severe cases, treatment will involve:  Treating the cause of the kidney injury. This may involve changing any medicines you are taking or adjusting your dosage.  Fluids. You may need specialized IV fluids to balance your body's needs.  Having a catheter placed to drain urine and prevent blockages.  Preventing problems from occurring. This may mean avoiding certain medicines or procedures that can cause further injury to the kidneys. In some cases treatment may also require:  A procedure to remove toxic wastes from the body (dialysis or continuous renal replacement therapy - CRRT).  Surgery. This may be done to repair a torn kidney, or to remove the blockage from the urinary system. Follow these instructions at home: Medicines   Take over-the-counter and prescription medicines only as told by your health care provider.  Do not take any new medicines without your health care provider's approval. Many medicines can worsen your kidney damage.  Do not take any vitamin and mineral supplements without your health care provider's approval. Many nutritional supplements can worsen your kidney damage. Lifestyle   If your health care provider prescribed changes to your diet, follow them. You may need to decrease the amount of protein you eat.  Achieve and maintain a   healthy weight. If you need help with this, ask your health care provider.  Start or continue an exercise plan. Try to exercise at least 30 minutes a day, 5 days a week.  Do not use any tobacco products, such as cigarettes, chewing tobacco, and e-cigarettes. If you need help quitting, ask  your health care provider. General instructions   Keep track of your blood pressure. Report changes in your blood pressure as told by your health care provider.  Stay up to date with immunizations. Ask your health care provider which immunizations you need.  Keep all follow-up visits as told by your health care provider. This is important. Where to find more information:  American Association of Kidney Patients: www.aakp.org  National Kidney Foundation: www.kidney.org  American Kidney Fund: www.akfinc.org  [facility]:  www.lifeoptions.org  www.kidneyschool.org Contact a health care provider if:  Your symptoms get worse.  You develop new symptoms. Get help right away if:  You develop symptoms of worsening kidney disease, which include:  Headaches.  Abnormally dark or light skin.  Easy bruising.  Frequent hiccups.  Chest pain.  Shortness of breath.  End of menstruation in women.  Seizures.  Confusion or altered mental status.  Abdominal or back pain.  Itchiness.  You have a fever.  Your body is producing less urine.  You have pain or bleeding when you urinate. Summary  Acute kidney injury is a sudden worsening of kidney function.  Acute kidney injury can be caused by problems with blood flow to the kidneys, direct damage to the kidneys, and sudden blockage of urine flow.  Symptoms of this condition may not be obvious until it becomes severe. Symptoms may include edema, lethargy, confusion, nausea or vomiting, and problems passing urine.  This condition can usually be diagnosed with blood tests, urine tests, and imaging tests. Sometimes a kidney biopsy is done to diagnose this condition.  Treatment for this condition often involves treating the underlying cause. It is treated with fluids, medicines, dialysis, diet changes, or surgery. This information is not intended to replace advice given to you by your health care provider.  Make sure you discuss any questions you have with your health care provider. Document Released: 10/22/2010 Document Revised: 03/29/2016 Document Reviewed: 03/29/2016 Elsevier Interactive Patient Education  2017 Elsevier Inc.  

## 2016-12-15 LAB — CULTURE, BLOOD (ROUTINE X 2)
CULTURE: NO GROWTH
CULTURE: NO GROWTH
SPECIAL REQUESTS: ADEQUATE
SPECIAL REQUESTS: ADEQUATE

## 2016-12-20 DIAGNOSIS — E291 Testicular hypofunction: Secondary | ICD-10-CM | POA: Diagnosis not present

## 2016-12-20 DIAGNOSIS — C61 Malignant neoplasm of prostate: Secondary | ICD-10-CM | POA: Diagnosis not present

## 2016-12-26 DIAGNOSIS — F3181 Bipolar II disorder: Secondary | ICD-10-CM | POA: Diagnosis not present

## 2017-01-01 DIAGNOSIS — Z9689 Presence of other specified functional implants: Secondary | ICD-10-CM | POA: Diagnosis not present

## 2017-01-01 DIAGNOSIS — M19172 Post-traumatic osteoarthritis, left ankle and foot: Secondary | ICD-10-CM | POA: Diagnosis not present

## 2017-01-01 DIAGNOSIS — S8252XK Displaced fracture of medial malleolus of left tibia, subsequent encounter for closed fracture with nonunion: Secondary | ICD-10-CM | POA: Diagnosis not present

## 2017-01-09 DIAGNOSIS — F3181 Bipolar II disorder: Secondary | ICD-10-CM | POA: Diagnosis not present

## 2017-01-17 DIAGNOSIS — Z87440 Personal history of urinary (tract) infections: Secondary | ICD-10-CM | POA: Diagnosis not present

## 2017-01-20 DIAGNOSIS — M7542 Impingement syndrome of left shoulder: Secondary | ICD-10-CM | POA: Diagnosis not present

## 2017-01-20 DIAGNOSIS — M7541 Impingement syndrome of right shoulder: Secondary | ICD-10-CM | POA: Diagnosis not present

## 2017-01-22 DIAGNOSIS — F3181 Bipolar II disorder: Secondary | ICD-10-CM | POA: Diagnosis not present

## 2017-01-23 DIAGNOSIS — N390 Urinary tract infection, site not specified: Secondary | ICD-10-CM | POA: Diagnosis not present

## 2017-01-23 DIAGNOSIS — B952 Enterococcus as the cause of diseases classified elsewhere: Secondary | ICD-10-CM | POA: Diagnosis not present

## 2017-01-23 DIAGNOSIS — N5201 Erectile dysfunction due to arterial insufficiency: Secondary | ICD-10-CM | POA: Diagnosis not present

## 2017-01-23 DIAGNOSIS — R3914 Feeling of incomplete bladder emptying: Secondary | ICD-10-CM | POA: Diagnosis not present

## 2017-01-27 DIAGNOSIS — M7541 Impingement syndrome of right shoulder: Secondary | ICD-10-CM | POA: Diagnosis not present

## 2017-01-30 DIAGNOSIS — M6281 Muscle weakness (generalized): Secondary | ICD-10-CM | POA: Diagnosis not present

## 2017-01-30 DIAGNOSIS — R152 Fecal urgency: Secondary | ICD-10-CM | POA: Diagnosis not present

## 2017-01-30 DIAGNOSIS — M62838 Other muscle spasm: Secondary | ICD-10-CM | POA: Diagnosis not present

## 2017-01-30 DIAGNOSIS — R3916 Straining to void: Secondary | ICD-10-CM | POA: Diagnosis not present

## 2017-01-30 DIAGNOSIS — R3914 Feeling of incomplete bladder emptying: Secondary | ICD-10-CM | POA: Diagnosis not present

## 2017-01-30 DIAGNOSIS — R102 Pelvic and perineal pain: Secondary | ICD-10-CM | POA: Diagnosis not present

## 2017-02-03 DIAGNOSIS — M12811 Other specific arthropathies, not elsewhere classified, right shoulder: Secondary | ICD-10-CM | POA: Diagnosis not present

## 2017-02-03 DIAGNOSIS — M75101 Unspecified rotator cuff tear or rupture of right shoulder, not specified as traumatic: Secondary | ICD-10-CM | POA: Diagnosis not present

## 2017-02-06 DIAGNOSIS — F3181 Bipolar II disorder: Secondary | ICD-10-CM | POA: Diagnosis not present

## 2017-02-11 DIAGNOSIS — F3181 Bipolar II disorder: Secondary | ICD-10-CM | POA: Diagnosis not present

## 2017-02-11 DIAGNOSIS — Z79899 Other long term (current) drug therapy: Secondary | ICD-10-CM | POA: Diagnosis not present

## 2017-02-19 DIAGNOSIS — F3181 Bipolar II disorder: Secondary | ICD-10-CM | POA: Diagnosis not present

## 2017-02-20 DIAGNOSIS — K581 Irritable bowel syndrome with constipation: Secondary | ICD-10-CM | POA: Diagnosis not present

## 2017-02-20 DIAGNOSIS — K219 Gastro-esophageal reflux disease without esophagitis: Secondary | ICD-10-CM | POA: Diagnosis not present

## 2017-02-20 DIAGNOSIS — Z86010 Personal history of colonic polyps: Secondary | ICD-10-CM | POA: Diagnosis not present

## 2017-03-07 DIAGNOSIS — F3181 Bipolar II disorder: Secondary | ICD-10-CM | POA: Diagnosis not present

## 2017-03-07 DIAGNOSIS — Z79899 Other long term (current) drug therapy: Secondary | ICD-10-CM | POA: Diagnosis not present

## 2017-03-25 DIAGNOSIS — M19172 Post-traumatic osteoarthritis, left ankle and foot: Secondary | ICD-10-CM | POA: Diagnosis not present

## 2017-03-25 DIAGNOSIS — T84498A Other mechanical complication of other internal orthopedic devices, implants and grafts, initial encounter: Secondary | ICD-10-CM | POA: Diagnosis not present

## 2017-04-02 DIAGNOSIS — F3181 Bipolar II disorder: Secondary | ICD-10-CM | POA: Diagnosis not present

## 2017-04-07 DIAGNOSIS — Z969 Presence of functional implant, unspecified: Secondary | ICD-10-CM | POA: Diagnosis not present

## 2017-04-07 DIAGNOSIS — M19172 Post-traumatic osteoarthritis, left ankle and foot: Secondary | ICD-10-CM | POA: Diagnosis not present

## 2017-04-29 ENCOUNTER — Encounter: Payer: Self-pay | Admitting: Physical Therapy

## 2017-04-29 ENCOUNTER — Ambulatory Visit: Payer: Medicare HMO | Attending: Orthopedic Surgery | Admitting: Physical Therapy

## 2017-04-29 DIAGNOSIS — M25611 Stiffness of right shoulder, not elsewhere classified: Secondary | ICD-10-CM | POA: Diagnosis present

## 2017-04-29 DIAGNOSIS — M25511 Pain in right shoulder: Secondary | ICD-10-CM | POA: Insufficient documentation

## 2017-04-29 NOTE — Therapy (Signed)
[place] [address] [place], [place], [address] Phone: [phone]   Fax:  [phone]  Physical Therapy Evaluation  Patient Details  Name: Colin Kelley MRN: [id number] Date of Birth: 1960-06-09 Referring Provider: Onnie Graham   Encounter Date: 04/29/2017  PT End of Session - 04/29/17 1648    Visit Number  1    Date for PT Re-Evaluation  06/27/17    PT Start Time  6962    PT Stop Time  1655    PT Time Calculation (min)  50 min    Activity Tolerance  Patient tolerated treatment well    Behavior During Therapy  [facility] for tasks assessed/performed       Past Medical History:  Diagnosis Date  . Alcohol use disorder, severe, in controlled environment ([place])   . Allergic rhinitis   . Attention deficit disorder without hyperactivity   . Cocaine use disorder, severe, dependence ([place])   . 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 Dyslipidemia, goal LDL below 130   . Hypertension   . Hypogonadism male   . OSA (obstructive sleep apnea)   . Spinal stenosis of lumbar region without neurogenic claudication   . Spondylolisthesis   . Substance induced mood disorder [facility])     Past Surgical History:  Procedure Laterality Date  . prostectomy    . SHOULDER SURGERY      There were no vitals filed for this visit.   Subjective Assessment - 04/29/17 1608    Subjective  Patient reports that he hurt his shoulder year ago and reports a surgery at least 15 years ago.  Reports that ovewr the past few months he has started having some increased right sholder pain, he is right handed.  A recent MRI showed RC tear.  He reports that he had recent injections but without help    Patient Stated Goals  have less pain, use the arm easier    Currently in Pain?  Yes    Pain Score  1     Pain Location  Shoulder    Pain Orientation  Right    Pain Descriptors / Indicators  Aching;Sore    Pain Type  Acute pain    Pain Onset  More  than a month ago    Pain Frequency  Constant    Aggravating Factors   reaching out and up, 7/10 pain, dressing does cause some increased pain    Pain Relieving Factors  rest, not moving the arm, pain can be 1/10    Effect of Pain on Daily Activities  difficuty with ADL's         [facility] PT Assessment - 04/29/17 0001      Assessment   Medical Diagnosis  RC tear right    Referring Provider  Supple    Onset Date/Surgical Date  04/17/17    Hand Dominance  Right      Precautions   Precautions  None      Balance Screen   Has the patient fallen in the past 6 months  No    Has the patient had a decrease in activity level because of a fear of falling?   No    Is the patient reluctant to leave their home because of a fear of falling?   No      Home Environment   Additional Comments  was doing some yardwork      Prior Function   Level of  Independence  Independent    Vocation  On disability    Leisure  no exercise      Posture/Postural Control   Posture Comments  fwd head, rounded shoulders      ROM / Strength   AROM / PROM / Strength  AROM;Strength;PROM      AROM   Overall AROM Comments  AROM is very slow and guarded    AROM Assessment Site  Shoulder    Right/Left Shoulder  Right    Right Shoulder Flexion  120 Degrees    Right Shoulder ABduction  120 Degrees    Right Shoulder Internal Rotation  15 Degrees    Right Shoulder External Rotation  75 Degrees      PROM   PROM Assessment Site  Shoulder    Right/Left Shoulder  Right    Right Shoulder Flexion  150 Degrees    Right Shoulder ABduction  150 Degrees    Right Shoulder Internal Rotation  22 Degrees    Right Shoulder External Rotation  85 Degrees      Strength   Overall Strength Comments  right shoulder strength is 4-/5      Palpation   Palpation comment  patient has some mm wasting and atrophy of the right posterior and superior shoulder area.             Objective measurements completed on examination: See  above findings.              PT Education - 04/29/17 1647    Education provided  Yes    Education Details  gave red tband with scapular stabilization, gentle ER    Person(s) Educated  Patient    Methods  Explanation;Demonstration;Tactile cues;Handout;Verbal cues    Comprehension  Verbal cues required;Tactile cues required;Returned demonstration;Verbalized understanding       PT Short Term Goals - 04/29/17 1709      PT SHORT TERM GOAL #1   Title  independent with initial HEP    Time  2    Period  Weeks    Status  New        PT Long Term Goals - 04/29/17 1710      PT LONG TERM GOAL #1   Title  increase IR ROM to 60 degrees    Time  8    Period  Weeks    Status  New      PT LONG TERM GOAL #2   Title  report pain decreased 50% for ADL's    Time  8    Period  Weeks    Status  New      PT LONG TERM GOAL #3   Title  increase AROM of right sholder flexion to 150 degrees    Time  8    Period  Weeks    Status  New      PT LONG TERM GOAL #4   Title  increase right shoulder strength to 4/5    Time  8    Period  Weeks    Status  New             Plan - 04/29/17 1648    Clinical Impression Statement  Patient reports a right shoulder injury with surgical repair about 15 years ago, he reports that over the past few months he has been having increased right shoulder pain.  An MRI showed right RC tear.  He also recently had a left ankle fusion surgery and is non weight  bearing.  His ROM was not too bad, but the quality of the motion was poor very slow and gaurded with compensation, his IR was the most limited at 15 degrees, strength was 4-/5 with pain.  He is in a w/c wiht slouched posture and rounded shoulders    Clinical Presentation  Stable    Clinical Decision Making  Low    Rehab Potential  Good    PT Frequency  2x / week    PT Duration  8 weeks    PT Treatment/Interventions  ADLs/Self Care Home Management;Cryotherapy;Electrical Stimulation;Moist  Heat;Therapeutic activities;Therapeutic exercise;Neuromuscular re-education;Manual techniques;Patient/family education    PT Next Visit Plan  work on scapular stabilizaiton and ROM    Consulted and Agree with Plan of Care  Patient       Patient will benefit from skilled therapeutic intervention in order to improve the following deficits and impairments:  Decreased range of motion, Impaired UE functional use, Increased muscle spasms, Pain, Decreased strength, Postural dysfunction  Visit Diagnosis: Acute pain of right shoulder - Plan: PT plan of care cert/re-cert  Stiffness of right shoulder, not elsewhere classified - Plan: PT plan of care cert/re-cert  G-Codes - 16/38/45 1713    Functional Assessment Tool Used (Outpatient Only)  foto 55% limitation        Problem List Patient Active Problem List   Diagnosis Date Noted  . Pressure injury of skin 12/11/2016  . Acute metabolic encephalopathy 36/46/8032  . Acute renal failure (ARF) ([place]) 12/09/2016  . Lithium toxicity 12/08/2016    Sumner Boast., PT 04/29/2017, 5:14 PM  [address] [address] [place], [place], [address] Phone: [phone]   Fax:  [phone]  Name: Colin Kelley MRN: [id number] Date of Birth: 12/28/1960

## 2017-05-05 ENCOUNTER — Ambulatory Visit: Payer: Medicare HMO | Admitting: Physical Therapy

## 2017-05-06 ENCOUNTER — Ambulatory Visit: Payer: Medicare HMO | Admitting: Physical Therapy

## 2017-05-06 ENCOUNTER — Encounter: Payer: Self-pay | Admitting: Physical Therapy

## 2017-05-06 DIAGNOSIS — M25511 Pain in right shoulder: Secondary | ICD-10-CM | POA: Diagnosis not present

## 2017-05-06 DIAGNOSIS — M25611 Stiffness of right shoulder, not elsewhere classified: Secondary | ICD-10-CM

## 2017-05-06 NOTE — Therapy (Signed)
[place] [address] [place], [place], [address] Phone: [phone]   Fax:  [phone]  Physical Therapy Treatment  Patient Details  Name: MATTOX SCHORR MRN: [id number] Date of Birth: 1961-01-03 Referring Provider: Onnie Graham   Encounter Date: 05/06/2017  PT End of Session - 05/06/17 1738    Visit Number  2    Date for PT Re-Evaluation  06/27/17    PT Start Time  1324    PT Stop Time  1740    PT Time Calculation (min)  50 min    Activity Tolerance  Patient tolerated treatment well    Behavior During Therapy  [facility] for tasks assessed/performed       Past Medical History:  Diagnosis Date  . Alcohol use disorder, severe, in controlled environment ([place])   . Allergic rhinitis   . Attention deficit disorder without hyperactivity   . Cocaine use disorder, severe, dependence ([place])   . 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 Dyslipidemia, goal LDL below 130   . Hypertension   . Hypogonadism male   . OSA (obstructive sleep apnea)   . Spinal stenosis of lumbar region without neurogenic claudication   . Spondylolisthesis   . Substance induced mood disorder [facility])     Past Surgical History:  Procedure Laterality Date  . prostectomy    . SHOULDER SURGERY      There were no vitals filed for this visit.  Subjective Assessment - 05/06/17 1658    Subjective  Patient reports that he has had some increase of pain with the HEP.      Currently in Pain?  Yes    Pain Score  3     Pain Location  Shoulder    Pain Orientation  Right    Aggravating Factors   exercises                      [facility] Adult PT Treatment/Exercise - 05/06/17 0001      Exercises   Exercises  Shoulder      Shoulder Exercises: Seated   Extension  20 reps;Theraband    Theraband Level (Shoulder Extension)  Level 3 (Green)    Row  20 reps;Theraband    Theraband Level (Shoulder Row)  Level 3 (Green)    External  Rotation  20 reps;Theraband    Theraband Level (Shoulder External Rotation)  Level 1 (Yellow)    Internal Rotation  20 reps;Theraband    Theraband Level (Shoulder Internal Rotation)  Level 2 (Red)    Internal Rotation Limitations  had to limit motions and really set scapula as he had a lot of popping    Other Seated Exercises  chest press with green tband      Shoulder Exercises: ROM/Strengthening   UBE (Upper Arm Bike)  level 3 x 4 minutes    Cybex Row  1.5 plate;20 reps    Other ROM/Strengthening Exercises  lats 20# 2x10      Shoulder Exercises: Stretch   Other Shoulder Stretches  Passive stretches to the right shoulder to pain      Modalities   Modalities  Electrical Stimulation;Cryotherapy      Cryotherapy   Number Minutes Cryotherapy  15 Minutes    Cryotherapy Location  Shoulder    Type of Cryotherapy  Ice pack      Electrical Stimulation   Electrical Stimulation Location  right upper trap area    Electrical Stimulation  Action  IFC    Electrical Stimulation Parameters  sitting    Electrical Stimulation Goals  Pain               PT Short Term Goals - 05/06/17 1740      PT SHORT TERM GOAL #1   Title  independent with initial HEP    Status  Partially Met        PT Long Term Goals - 04/29/17 1710      PT LONG TERM GOAL #1   Title  increase IR ROM to 60 degrees    Time  8    Period  Weeks    Status  New      PT LONG TERM GOAL #2   Title  report pain decreased 50% for ADL's    Time  8    Period  Weeks    Status  New      PT LONG TERM GOAL #3   Title  increase AROM of right sholder flexion to 150 degrees    Time  8    Period  Weeks    Status  New      PT LONG TERM GOAL #4   Title  increase right shoulder strength to 4/5    Time  8    Period  Weeks    Status  New            Plan - 05/06/17 1738    Clinical Impression Statement  Patient with a lot of popping with many exercises especially rotation, needs cue to set scapula and to limit ROM  so he does not have the significant crepitus, Pain today was mostly in the upper trap at res, and in the shoulder joint with the exercises that caused the popping    PT Next Visit Plan  work on scapular stabilizaiton and ROM, may have to limit the ROM and really work on posture to limit the popping    Consulted and Agree with Plan of Care  Patient       Patient will benefit from skilled therapeutic intervention in order to improve the following deficits and impairments:  Decreased range of motion, Impaired UE functional use, Increased muscle spasms, Pain, Decreased strength, Postural dysfunction  Visit Diagnosis: Acute pain of right shoulder  Stiffness of right shoulder, not elsewhere classified     Problem List Patient Active Problem List   Diagnosis Date Noted  . Pressure injury of skin 12/11/2016  . Acute metabolic encephalopathy 47/34/0370  . Acute renal failure (ARF) ([place]) 12/09/2016  . Lithium toxicity 12/08/2016    Sumner Boast., PT 05/06/2017, 5:40 PM  [place] [address] [place], [place], [address] Phone: [phone]   Fax:  [phone]  Name: OMAURI BOEVE MRN: [id number] Date of Birth: 09/08/1960

## 2017-05-07 ENCOUNTER — Ambulatory Visit: Payer: Medicare HMO | Admitting: Physical Therapy

## 2017-05-12 ENCOUNTER — Ambulatory Visit: Payer: Medicare HMO | Admitting: Physical Therapy

## 2017-05-12 ENCOUNTER — Encounter: Payer: Self-pay | Admitting: Physical Therapy

## 2017-05-12 DIAGNOSIS — M25511 Pain in right shoulder: Secondary | ICD-10-CM | POA: Diagnosis not present

## 2017-05-12 DIAGNOSIS — M25611 Stiffness of right shoulder, not elsewhere classified: Secondary | ICD-10-CM

## 2017-05-12 NOTE — Therapy (Signed)
[place], [place], [address] Phone: [phone]   Fax:  [phone]  Physical Therapy Treatment  Patient Details  Name: Colin Kelley MRN: [id number] Date of Birth: 11-May-1960 Referring Provider: Onnie Graham   Encounter Date: 05/12/2017  PT End of Session - 05/12/17 1227    Visit Number  3    Date for PT Re-Evaluation  06/27/17    PT Start Time  1146    PT Stop Time  1240    PT Time Calculation (min)  54 min       Past Medical History:  Diagnosis Date  . Alcohol use disorder, severe, in controlled environment ([place])   . Allergic rhinitis   . Attention deficit disorder without hyperactivity   . Cocaine use disorder, severe, dependence ([place])   . 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 Dyslipidemia, goal LDL below 130   . Hypertension   . Hypogonadism male   . OSA (obstructive sleep apnea)   . Spinal stenosis of lumbar region without neurogenic claudication   . Spondylolisthesis   . Substance induced mood disorder [facility])     Past Surgical History:  Procedure Laterality Date  . prostectomy    . SHOULDER SURGERY      There were no vitals filed for this visit.  Subjective Assessment - 05/12/17 1150    Subjective  Pt reports increase shoulder pain from his HEP exercises.    Currently in Pain?  No/denies    Pain Score  0-No pain                      [facility] Adult PT Treatment/Exercise - 05/12/17 0001      Shoulder Exercises: Seated   Extension  20 reps;Theraband;Right    Theraband Level (Shoulder Extension)  Level 3 (Green)    Row  20 reps;Theraband    Theraband Level (Shoulder Row)  Level 3 (Green)    External Rotation  20 reps;Theraband    Theraband Level (Shoulder External Rotation)  Level 1 (Yellow)    Internal Rotation  20 reps;Theraband    Flexion  Both;12 reps;Weights    Flexion Weight (lbs)  1    Other Seated Exercises  Abs AROM 2x10     Other  Seated Exercises  bilat forward presses with ball 2x10      Shoulder Exercises: ROM/Strengthening   UBE (Upper Arm Bike)  level 3 x 4 minutes      Shoulder Exercises: Stretch   Other Shoulder Stretches  Passive stretches to the right shoulder to pain      Modalities   Modalities  Electrical Stimulation;Cryotherapy      Cryotherapy   Number Minutes Cryotherapy  15 Minutes    Cryotherapy Location  Shoulder    Type of Cryotherapy  Ice pack      Electrical Stimulation   Electrical Stimulation Location  right upper trap area    Electrical Stimulation Action  IFC    Electrical Stimulation Parameters  sitting    Electrical Stimulation Goals  Pain               PT Short Term Goals - 05/06/17 1740      PT SHORT TERM GOAL #1   Title  independent with initial HEP    Status  Partially Met        PT Long Term Goals - 04/29/17 1710      PT LONG  TERM GOAL #1   Title  increase IR ROM to 60 degrees    Time  8    Period  Weeks    Status  New      PT LONG TERM GOAL #2   Title  report pain decreased 50% for ADL's    Time  8    Period  Weeks    Status  New      PT LONG TERM GOAL #3   Title  increase AROM of right sholder flexion to 150 degrees    Time  8    Period  Weeks    Status  New      PT LONG TERM GOAL #4   Title  increase right shoulder strength to 4/5    Time  8    Period  Weeks    Status  New            Plan - 05/12/17 1228    Clinical Impression Statement  Pt reports pain with HEP so updated interventions given. Pt able to complete all of today's exercises. Some pain reported after first set of resisted flexion so second set without weight. No issues with active shoulder abduction. Postural cues given during external rotation.    Rehab Potential  Good    PT Frequency  2x / week    PT Duration  8 weeks    PT Treatment/Interventions  ADLs/Self Care Home Management;Cryotherapy;Electrical Stimulation;Moist Heat;Therapeutic activities;Therapeutic  exercise;Neuromuscular re-education;Manual techniques;Patient/family education    PT Next Visit Plan  work on scapular stabilizaiton and ROM, may have to limit the ROM and really work on posture to limit the popping    PT Home Exercise Plan  Scapular retraction, Supine shoulder flexion AAROM,  Shoulder flexion and abduction seated no weight.       Patient will benefit from skilled therapeutic intervention in order to improve the following deficits and impairments:  Decreased range of motion, Impaired UE functional use, Increased muscle spasms, Pain, Decreased strength, Postural dysfunction  Visit Diagnosis: Acute pain of right shoulder  Stiffness of right shoulder, not elsewhere classified     Problem List Patient Active Problem List   Diagnosis Date Noted  . Pressure injury of skin 12/11/2016  . Acute metabolic encephalopathy 22/63/3354  . Acute renal failure (ARF) ([place]) 12/09/2016  . Lithium toxicity 12/08/2016    Scot Jun, PTA 05/12/2017, 12:30 PM  [place] [address] [address] [place], [place], [address] Phone: [phone]   Fax:  [phone]  Name: Colin Kelley MRN: [id number] Date of Birth: 17-Sep-1960

## 2017-05-22 ENCOUNTER — Encounter: Payer: Self-pay | Admitting: Physical Therapy

## 2017-05-22 ENCOUNTER — Ambulatory Visit: Payer: Medicare HMO | Admitting: Physical Therapy

## 2017-05-22 DIAGNOSIS — M25511 Pain in right shoulder: Secondary | ICD-10-CM

## 2017-05-22 DIAGNOSIS — M25611 Stiffness of right shoulder, not elsewhere classified: Secondary | ICD-10-CM

## 2017-05-22 NOTE — Therapy (Signed)
[place], [place], [address] Phone: [phone]   Fax:  [phone]  Physical Therapy Treatment  Patient Details  Name: Colin Kelley MRN: [id number] Date of Birth: Nov 07, 1960 Referring Provider: Onnie Graham   Encounter Date: 05/22/2017  PT End of Session - 05/22/17 1220    Visit Number  4    Date for PT Re-Evaluation  06/27/17    PT Start Time  1143    PT Stop Time  1224    PT Time Calculation (min)  41 min    Activity Tolerance  Patient tolerated treatment well    Behavior During Therapy  [facility] for tasks assessed/performed       Past Medical History:  Diagnosis Date  . Alcohol use disorder, severe, in controlled environment ([place])   . Allergic rhinitis   . Attention deficit disorder without hyperactivity   . Cocaine use disorder, severe, dependence ([place])   . 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 Dyslipidemia, goal LDL below 130   . Hypertension   . Hypogonadism male   . OSA (obstructive sleep apnea)   . Spinal stenosis of lumbar region without neurogenic claudication   . Spondylolisthesis   . Substance induced mood disorder [facility])     Past Surgical History:  Procedure Laterality Date  . prostectomy    . SHOULDER SURGERY      There were no vitals filed for this visit.  Subjective Assessment - 05/22/17 1141    Subjective  "Maybe a little bit better but no much"    Currently in Pain?  Yes    Pain Score  2     Pain Location  Shoulder    Pain Orientation  Right;Posterior                      [facility] Adult PT Treatment/Exercise - 05/22/17 0001      Shoulder Exercises: Seated   Row  20 reps;Theraband x2    Theraband Level (Shoulder Row)  Level 3 (Green)    Horizontal ABduction  Theraband;20 reps;Both    Theraband Level (Shoulder Horizontal ABduction)  Level 2 (Red)    External Rotation  20 reps;Theraband    Theraband Level (Shoulder External  Rotation)  Level 1 (Yellow)    Flexion  Both;12 reps;Weights    Flexion Weight (lbs)  1    Abduction  Both;Weights;20 reps    ABduction Weight (lbs)  1    Other Seated Exercises  Holding red ball IR/ER    Other Seated Exercises  bilat forward presses with ball 2x10      Shoulder Exercises: ROM/Strengthening   UBE (Upper Arm Bike)  level 3 x 6 minutes    Cybex Row  1.5 plate;20 reps    Other ROM/Strengthening Exercises  lats 20# 2x10      Shoulder Exercises: Stretch   Other Shoulder Stretches  Passive stretches to the right shoulder to pain               PT Short Term Goals - 05/06/17 1740      PT SHORT TERM GOAL #1   Title  independent with initial HEP    Status  Partially Met        PT Long Term Goals - 05/22/17 1222      PT LONG TERM GOAL #1   Title  increase IR ROM to 60 degrees    Status  Partially Met  PT LONG TERM GOAL #2   Title  report pain decreased 50% for ADL's    Status  Partially Met      PT LONG TERM GOAL #3   Title  increase AROM of right sholder flexion to 150 degrees    Status  On-going            Plan - 05/22/17 1220    Clinical Impression Statement  All exercises completed well, went over HEP interventions first. Pt does fatigue quick with active shoulder flexion and abduction. Does report some pain with abduction.    Rehab Potential  Good    PT Frequency  2x / week    PT Duration  8 weeks    PT Treatment/Interventions  ADLs/Self Care Home Management;Cryotherapy;Electrical Stimulation;Moist Heat;Therapeutic activities;Therapeutic exercise;Neuromuscular re-education;Manual techniques;Patient/family education    PT Next Visit Plan  work on scapular stabilizaiton and ROM, may have to limit the ROM and really work on posture to limit the popping       Patient will benefit from skilled therapeutic intervention in order to improve the following deficits and impairments:  Decreased range of motion, Impaired UE functional use, Increased  muscle spasms, Pain, Decreased strength, Postural dysfunction  Visit Diagnosis: Stiffness of right shoulder, not elsewhere classified  Acute pain of right shoulder     Problem List Patient Active Problem List   Diagnosis Date Noted  . Pressure injury of skin 12/11/2016  . Acute metabolic encephalopathy 00/37/9444  . Acute renal failure (ARF) ([place]) 12/09/2016  . Lithium toxicity 12/08/2016    Colin Kelley, Colin Kelley 05/22/2017, 12:22 PM  [place], [place], [address] Phone: [phone]   Fax:  [phone]  Name: Colin Kelley MRN: [id number] Date of Birth: 1961/03/30

## 2018-02-06 ENCOUNTER — Ambulatory Visit: Payer: Self-pay | Admitting: Orthopedic Surgery

## 2018-02-06 NOTE — Progress Notes (Signed)
Received pt clearance via fax from dr beane office, placed with chart.

## 2018-02-10 ENCOUNTER — Ambulatory Visit: Payer: Self-pay | Admitting: Orthopedic Surgery

## 2018-02-10 NOTE — H&P (View-Only) (Signed)
Colin Kelley is an 57 y.o. male.   Chief Complaint: R knee pain HPI: Reported by patient. Visit For: Follow up Location: right; knee Duration: The patient is 3 1/2 months out from when symptoms began. Severity: pain level 7/10  Past Medical History:  Diagnosis Date  . Alcohol use disorder, severe, in controlled environment ([place])   . Allergic rhinitis   . Attention deficit disorder without hyperactivity   . Cocaine use disorder, severe, dependence ([place])   . 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 Dyslipidemia, goal LDL below 130   . Hypertension   . Hypogonadism male   . OSA (obstructive sleep apnea)   . Spinal stenosis of lumbar region without neurogenic claudication   . Spondylolisthesis   . Substance induced mood disorder [facility])     Past Surgical History:  Procedure Laterality Date  . prostectomy    . SHOULDER SURGERY      No family history on file. Social History:  reports that he quit smoking about 3 years ago. His smokeless tobacco use includes chew. He reports that he does not drink alcohol or use drugs.  Allergies:  Allergies  Allergen Reactions  . Shellfish-Derived Products Hives  . Fructose Nausea And Vomiting  . Sulfa Antibiotics Rash   Medications amLODIPine 2.5 mg tablet amLODIPine 5 mg tablet atorvastatin 20 mg tablet azelastine 0.05 % eye drops azelastine 137 mcg (0.1 %) nasal spray aerosol Breo Ellipta 100 mcg-25 mcg/dose powder for inhalation cephALEXin 250 mg tablet cephALEXin 500 mg capsule Cialis 20 mg tablet dicyclomine 20 mg tablet fluticasone propionate 50 mcg/actuation nasal spray,suspension gabapentin 600 mg tablet gabapentin 800 mg tablet ketoconazole 2 % shampoo lamoTRIgine 200 mg tablet Lastacaft 0.25 % eye drops levocetirizine 5 mg tablet lisinopril 40 mg tablet lithium carbonate 150 mg capsule lithium carbonate ER 300 mg tablet,extended release nitrofurantoin monohydrate/macrocrystals 100 mg capsule Norco 5  mg-325 mg tablet OLANZapine 5 mg tablet polyethylene glycol 3350 17 gram/dose oral powder propranolol 20 mg tablet propranolol ER 60 mg capsule,24 hr,extended release QUEtiapine 25 mg tablet QUEtiapine ER 300 mg tablet,extended release 24 hr Rexulti 0.5 mg tablet risperiDONE 2 mg tablet sildenafil (antihypertensive) 20 mg tablet tolterodine 2 mg tablet traZODone 50 mg tablet Ventolin HFA 90 mcg/actuation aerosol inhaler zolpidem 10 mg tablet   Review of Systems  Constitutional: Negative.   HENT: Negative.   Eyes: Negative.   Respiratory: Negative.   Cardiovascular: Negative.   Gastrointestinal: Negative.   Genitourinary: Negative.   Musculoskeletal: Positive for joint pain.  Skin: Negative.   Neurological: Negative.   Psychiatric/Behavioral: Negative.     There were no vitals taken for this visit. Physical Exam  Constitutional: He is oriented to person, place, and time. He appears well-developed and well-nourished.  HENT:  Head: Normocephalic.  Eyes: Pupils are equal, round, and reactive to light.  Neck: Normal range of motion.  Cardiovascular: Normal rate.  Respiratory: Effort normal.  GI: Soft.  Musculoskeletal:  Patient is a 57 year old male.  Constitutional: General Appearance: healthy-appearing and NAD.  Gait and Station: Appearance: antalgic gait.  Cardiovascular System: Arterial Pulses Right: femoral normal, popliteal normal, dorsalis pedis normal, and posterior tibialis normal. Edema Right: no edema. Varicosities Right: no varicosities. Varicosities Left: no varicosities and capillary refill test normal.  Lymph Nodes: Inspection/Palpation Right: no inguinal LAD.  Knees: Inspection Right: no deformity and swelling. Bony Palpation Right: no tenderness of the inferior pole patella, the superior pole patella, the tibial tubercle, the medial tibial plateau, Gerdy's tubercle,  or the neck of fibula and tenderness of the lateral joint line and the medial joint line.  Soft Tissue Palpation Right: no tenderness of the quadriceps tendon, the prepatellar bursa, the patellar tendon, the medial collateral ligament, the saphenous nerve, the lateral collateral ligament, the infrapatellar tendon, or the common peroneal nerve. Active Range of Motion Right: limited. Passive Range of Motion RIght: limited. Stability Right: no laxity or ligamentous instability and anterior drawer sign negative, posterior drawer sign negative, and Lachman test negative. Special Tests Right: McMurray's test positive. Strength Right: no hamstring weakness or quadriceps weakness and flexion 5/5 and extension 5/5.  Skin: Right Lower Extremity: normal.  Neurologic: Ankle Reflex Right: normal (2). Knee Reflex Right: normal (2). Sensation on the Right: L2 normal, L3 normal, L4 normal, L5 normal, and S1 normal.  Psychiatric: Mood and Affect: active and alert and normal mood.  Neurological: He is alert and oriented to person, place, and time.  Skin: Skin is warm and dry.     Assessment/Plan Patient continues to have symptomatically osteoarthritis of the knee.  Also signs of giving way.  Discussed living with his symptoms versus knee arthroscopy.  He would like to proceed with the latter.  I discussed the risk and benefits of knee arthroscopy including no changes in their symptoms worsening in their symptoms DVT, PE, anesthetic complications etc. Surgical possibilities include chondroplasty, microfracture, partial meniscectomy, plica excision etc. We also discussed the possible need for repeat arthroscopy in the future as well as possible continued treatment including corticosteroid injections and possible Visco supplementation. In addition we discussed the possibility of even eventually required a total knee replacement if significant arthritis encountered. Also indicating that it is an outpatient procedure. 1-2 days on crutches. Postoperative DVT prophylaxis with aspirin if tolerated. Follow-up in  the office in 2 weeks following the surgery. Possible consideration of formal of supervised physical therapy as well.  Wanted something for pain in the meantime.  We discussed use of hydrocodone postoperatively in a given prescription to get now. Is taking that in the past without complication.  I reviewed his drug monitoring report. Review potential interactions. And precautions to take that medicine only when absolutely needed.  He has had no adverse side effects from taking it in the past.  Plan: right knee arthroscopy, partial medial meniscectomy, debridement  Cecilie Kicks., PA-C for Dr. Tonita Cong 02/10/2018, 2:56 PM

## 2018-02-10 NOTE — H&P (Signed)
Colin Kelley is an 57 y.o. male.   Chief Complaint: R knee pain HPI: Reported by patient. Visit For: Follow up Location: right; knee Duration: The patient is 3 1/2 months out from when symptoms began. Severity: pain level 7/10  Past Medical History:  Diagnosis Date  . Alcohol use disorder, severe, in controlled environment ([place])   . Allergic rhinitis   . Attention deficit disorder without hyperactivity   . Cocaine use disorder, severe, dependence ([place])   . 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 Dyslipidemia, goal LDL below 130   . Hypertension   . Hypogonadism male   . OSA (obstructive sleep apnea)   . Spinal stenosis of lumbar region without neurogenic claudication   . Spondylolisthesis   . Substance induced mood disorder [facility])     Past Surgical History:  Procedure Laterality Date  . prostectomy    . SHOULDER SURGERY      No family history on file. Social History:  reports that he quit smoking about 3 years ago. His smokeless tobacco use includes chew. He reports that he does not drink alcohol or use drugs.  Allergies:  Allergies  Allergen Reactions  . Shellfish-Derived Products Hives  . Fructose Nausea And Vomiting  . Sulfa Antibiotics Rash   Medications amLODIPine 2.5 mg tablet amLODIPine 5 mg tablet atorvastatin 20 mg tablet azelastine 0.05 % eye drops azelastine 137 mcg (0.1 %) nasal spray aerosol Breo Ellipta 100 mcg-25 mcg/dose powder for inhalation cephALEXin 250 mg tablet cephALEXin 500 mg capsule Cialis 20 mg tablet dicyclomine 20 mg tablet fluticasone propionate 50 mcg/actuation nasal spray,suspension gabapentin 600 mg tablet gabapentin 800 mg tablet ketoconazole 2 % shampoo lamoTRIgine 200 mg tablet Lastacaft 0.25 % eye drops levocetirizine 5 mg tablet lisinopril 40 mg tablet lithium carbonate 150 mg capsule lithium carbonate ER 300 mg tablet,extended release nitrofurantoin monohydrate/macrocrystals 100 mg capsule Norco 5  mg-325 mg tablet OLANZapine 5 mg tablet polyethylene glycol 3350 17 gram/dose oral powder propranolol 20 mg tablet propranolol ER 60 mg capsule,24 hr,extended release QUEtiapine 25 mg tablet QUEtiapine ER 300 mg tablet,extended release 24 hr Rexulti 0.5 mg tablet risperiDONE 2 mg tablet sildenafil (antihypertensive) 20 mg tablet tolterodine 2 mg tablet traZODone 50 mg tablet Ventolin HFA 90 mcg/actuation aerosol inhaler zolpidem 10 mg tablet   Review of Systems  Constitutional: Negative.   HENT: Negative.   Eyes: Negative.   Respiratory: Negative.   Cardiovascular: Negative.   Gastrointestinal: Negative.   Genitourinary: Negative.   Musculoskeletal: Positive for joint pain.  Skin: Negative.   Neurological: Negative.   Psychiatric/Behavioral: Negative.     There were no vitals taken for this visit. Physical Exam  Constitutional: He is oriented to person, place, and time. He appears well-developed and well-nourished.  HENT:  Head: Normocephalic.  Eyes: Pupils are equal, round, and reactive to light.  Neck: Normal range of motion.  Cardiovascular: Normal rate.  Respiratory: Effort normal.  GI: Soft.  Musculoskeletal:  Patient is a 57 year old male.  Constitutional: General Appearance: healthy-appearing and NAD.  Gait and Station: Appearance: antalgic gait.  Cardiovascular System: Arterial Pulses Right: femoral normal, popliteal normal, dorsalis pedis normal, and posterior tibialis normal. Edema Right: no edema. Varicosities Right: no varicosities. Varicosities Left: no varicosities and capillary refill test normal.  Lymph Nodes: Inspection/Palpation Right: no inguinal LAD.  Knees: Inspection Right: no deformity and swelling. Bony Palpation Right: no tenderness of the inferior pole patella, the superior pole patella, the tibial tubercle, the medial tibial plateau, Gerdy's tubercle,  or the neck of fibula and tenderness of the lateral joint line and the medial joint line.  Soft Tissue Palpation Right: no tenderness of the quadriceps tendon, the prepatellar bursa, the patellar tendon, the medial collateral ligament, the saphenous nerve, the lateral collateral ligament, the infrapatellar tendon, or the common peroneal nerve. Active Range of Motion Right: limited. Passive Range of Motion RIght: limited. Stability Right: no laxity or ligamentous instability and anterior drawer sign negative, posterior drawer sign negative, and Lachman test negative. Special Tests Right: McMurray's test positive. Strength Right: no hamstring weakness or quadriceps weakness and flexion 5/5 and extension 5/5.  Skin: Right Lower Extremity: normal.  Neurologic: Ankle Reflex Right: normal (2). Knee Reflex Right: normal (2). Sensation on the Right: L2 normal, L3 normal, L4 normal, L5 normal, and S1 normal.  Psychiatric: Mood and Affect: active and alert and normal mood.  Neurological: He is alert and oriented to person, place, and time.  Skin: Skin is warm and dry.     Assessment/Plan Patient continues to have symptomatically osteoarthritis of the knee.  Also signs of giving way.  Discussed living with his symptoms versus knee arthroscopy.  He would like to proceed with the latter.  I discussed the risk and benefits of knee arthroscopy including no changes in their symptoms worsening in their symptoms DVT, PE, anesthetic complications etc. Surgical possibilities include chondroplasty, microfracture, partial meniscectomy, plica excision etc. We also discussed the possible need for repeat arthroscopy in the future as well as possible continued treatment including corticosteroid injections and possible Visco supplementation. In addition we discussed the possibility of even eventually required a total knee replacement if significant arthritis encountered. Also indicating that it is an outpatient procedure. 1-2 days on crutches. Postoperative DVT prophylaxis with aspirin if tolerated. Follow-up in  the office in 2 weeks following the surgery. Possible consideration of formal of supervised physical therapy as well.  Wanted something for pain in the meantime.  We discussed use of hydrocodone postoperatively in a given prescription to get now. Is taking that in the past without complication.  I reviewed his drug monitoring report. Review potential interactions. And precautions to take that medicine only when absolutely needed.  He has had no adverse side effects from taking it in the past.  Plan: right knee arthroscopy, partial medial meniscectomy, debridement  Cecilie Kicks., PA-C for Dr. Tonita Cong 02/10/2018, 2:56 PM

## 2018-02-16 ENCOUNTER — Other Ambulatory Visit: Payer: Self-pay

## 2018-02-16 ENCOUNTER — Encounter (HOSPITAL_BASED_OUTPATIENT_CLINIC_OR_DEPARTMENT_OTHER): Payer: Self-pay | Admitting: *Deleted

## 2018-02-16 NOTE — Progress Notes (Signed)
Spoke with [place] Npo after midnight food, clear liquids from midnight until 700 am then npo arrive 1100 am 02-20-18 [facility] meds to take:albuterol inhaler prn/bring inhaler, atorvastatin, astelin nasal spray, optivar eye drop, breo ellipta inhaler, gabapentin, lamictal, propranol Medical clearance dr Susie Cassette 02-04-18 on chart driver father howard Cashman  surgery orders in epic  needs I stat 4 and ekg

## 2018-02-20 ENCOUNTER — Ambulatory Visit (HOSPITAL_BASED_OUTPATIENT_CLINIC_OR_DEPARTMENT_OTHER)
Admission: RE | Admit: 2018-02-20 | Discharge: 2018-02-20 | Disposition: A | Discharge status: Home / Self Care | Payer: Medicare HMO | Source: Ambulatory Visit | Attending: Specialist | Admitting: Specialist

## 2018-02-20 ENCOUNTER — Encounter (HOSPITAL_BASED_OUTPATIENT_CLINIC_OR_DEPARTMENT_OTHER): Payer: Self-pay | Admitting: Emergency Medicine

## 2018-02-20 ENCOUNTER — Encounter (HOSPITAL_BASED_OUTPATIENT_CLINIC_OR_DEPARTMENT_OTHER)
Admission: RE | Disposition: A | Discharge status: Home / Self Care | Payer: Self-pay | Source: Ambulatory Visit | Attending: Specialist

## 2018-02-20 ENCOUNTER — Ambulatory Visit (HOSPITAL_BASED_OUTPATIENT_CLINIC_OR_DEPARTMENT_OTHER): Payer: Medicare HMO | Admitting: Anesthesiology

## 2018-02-20 ENCOUNTER — Other Ambulatory Visit: Payer: Self-pay

## 2018-02-20 DIAGNOSIS — Z6839 Body mass index (BMI) 39.0-39.9, adult: Secondary | ICD-10-CM | POA: Insufficient documentation

## 2018-02-20 DIAGNOSIS — J449 Chronic obstructive pulmonary disease, unspecified: Secondary | ICD-10-CM | POA: Diagnosis not present

## 2018-02-20 DIAGNOSIS — X58XXXA Exposure to other specified factors, initial encounter: Secondary | ICD-10-CM | POA: Insufficient documentation

## 2018-02-20 DIAGNOSIS — Z87891 Personal history of nicotine dependence: Secondary | ICD-10-CM | POA: Insufficient documentation

## 2018-02-20 DIAGNOSIS — Z79899 Other long term (current) drug therapy: Secondary | ICD-10-CM | POA: Diagnosis not present

## 2018-02-20 DIAGNOSIS — E785 Hyperlipidemia, unspecified: Secondary | ICD-10-CM | POA: Diagnosis not present

## 2018-02-20 DIAGNOSIS — S83241A Other tear of medial meniscus, current injury, right knee, initial encounter: Secondary | ICD-10-CM | POA: Diagnosis present

## 2018-02-20 DIAGNOSIS — G4733 Obstructive sleep apnea (adult) (pediatric): Secondary | ICD-10-CM | POA: Insufficient documentation

## 2018-02-20 DIAGNOSIS — I10 Essential (primary) hypertension: Secondary | ICD-10-CM | POA: Diagnosis not present

## 2018-02-20 DIAGNOSIS — S83281A Other tear of lateral meniscus, current injury, right knee, initial encounter: Secondary | ICD-10-CM | POA: Diagnosis not present

## 2018-02-20 HISTORY — DX: Bipolar disorder, unspecified: F31.9

## 2018-02-20 HISTORY — DX: Malignant (primary) neoplasm, unspecified: C80.1

## 2018-02-20 HISTORY — PX: KNEE ARTHROSCOPY WITH MEDIAL MENISECTOMY: SHX5651

## 2018-02-20 LAB — POCT I-STAT 4, (NA,K, GLUC, HGB,HCT)
GLUCOSE: 108 mg/dL — AB (ref 70–99)
HCT: 40 % (ref 39.0–52.0)
Hemoglobin: 13.6 g/dL (ref 13.0–17.0)
Potassium: 4.1 mmol/L (ref 3.5–5.1)
Sodium: 141 mmol/L (ref 135–145)

## 2018-02-20 SURGERY — ARTHROSCOPY, KNEE, WITH MEDIAL MENISCECTOMY
Anesthesia: General | Site: Knee | Laterality: Right

## 2018-02-20 MED ORDER — SODIUM CHLORIDE 0.9 % IR SOLN
Status: DC | PRN
  Administered 2018-02-20: 6000 mL

## 2018-02-20 MED ORDER — DEXTROSE 5 % IV SOLN
3.0000 g | INTRAVENOUS | Status: AC
  Administered 2018-02-20: 2 g via INTRAVENOUS
  Administered 2018-02-20: 3 g via INTRAVENOUS
  Filled 2018-02-20 (×3): qty 3000

## 2018-02-20 MED ORDER — OXYCODONE HCL 5 MG PO TABS
5.0000 mg | ORAL_TABLET | Freq: Once | ORAL | Status: DC | PRN
  Filled 2018-02-20: qty 1

## 2018-02-20 MED ORDER — LACTATED RINGERS IV SOLN
INTRAVENOUS | Status: DC
  Administered 2018-02-20: 12:00:00 via INTRAVENOUS
  Filled 2018-02-20: qty 1000

## 2018-02-20 MED ORDER — CHLORHEXIDINE GLUCONATE 4 % EX LIQD
60.0000 mL | Freq: Once | CUTANEOUS | Status: DC
  Filled 2018-02-20: qty 118

## 2018-02-20 MED ORDER — OXYCODONE HCL 5 MG/5ML PO SOLN
5.0000 mg | Freq: Once | ORAL | Status: DC | PRN
  Filled 2018-02-20: qty 5

## 2018-02-20 MED ORDER — DEXAMETHASONE SODIUM PHOSPHATE 10 MG/ML IJ SOLN
INTRAMUSCULAR | Status: AC
  Filled 2018-02-20: qty 1

## 2018-02-20 MED ORDER — FENTANYL CITRATE (PF) 100 MCG/2ML IJ SOLN
INTRAMUSCULAR | Status: AC
  Filled 2018-02-20: qty 2

## 2018-02-20 MED ORDER — LACTATED RINGERS IV SOLN
INTRAVENOUS | Status: DC
  Administered 2018-02-20 (×2): via INTRAVENOUS
  Filled 2018-02-20: qty 1000

## 2018-02-20 MED ORDER — MIDAZOLAM HCL 5 MG/5ML IJ SOLN
INTRAMUSCULAR | Status: DC | PRN
  Administered 2018-02-20: 2 mg via INTRAVENOUS

## 2018-02-20 MED ORDER — FENTANYL CITRATE (PF) 100 MCG/2ML IJ SOLN
25.0000 ug | INTRAMUSCULAR | Status: DC | PRN
  Filled 2018-02-20: qty 1

## 2018-02-20 MED ORDER — FENTANYL CITRATE (PF) 100 MCG/2ML IJ SOLN
INTRAMUSCULAR | Status: DC | PRN
  Administered 2018-02-20: 50 ug via INTRAVENOUS
  Administered 2018-02-20: 25 ug via INTRAVENOUS

## 2018-02-20 MED ORDER — BUPIVACAINE-EPINEPHRINE 0.5% -1:200000 IJ SOLN
INTRAMUSCULAR | Status: DC | PRN
  Administered 2018-02-20: 15 mL

## 2018-02-20 MED ORDER — LIDOCAINE HCL 1 % IJ SOLN
INTRAMUSCULAR | Status: DC | PRN
  Administered 2018-02-20: 40 mg via INTRADERMAL

## 2018-02-20 MED ORDER — ONDANSETRON HCL 4 MG/2ML IJ SOLN
4.0000 mg | Freq: Once | INTRAMUSCULAR | Status: AC | PRN
  Administered 2018-02-20: 4 mg via INTRAVENOUS
  Filled 2018-02-20: qty 2

## 2018-02-20 MED ORDER — PROPOFOL 10 MG/ML IV BOLUS
INTRAVENOUS | Status: AC
  Filled 2018-02-20: qty 20

## 2018-02-20 MED ORDER — EPINEPHRINE PF 1 MG/ML IJ SOLN
INTRAMUSCULAR | Status: DC | PRN
  Administered 2018-02-20: 2 mg

## 2018-02-20 MED ORDER — OXYCODONE-ACETAMINOPHEN 5-325 MG PO TABS: 1.0000 | ORAL_TABLET | Freq: Four times a day (QID) | ORAL | 0 refills | Status: AC | PRN

## 2018-02-20 MED ORDER — PROPOFOL 10 MG/ML IV BOLUS
INTRAVENOUS | Status: DC | PRN
  Administered 2018-02-20: 200 mg via INTRAVENOUS

## 2018-02-20 MED ORDER — DEXAMETHASONE SODIUM PHOSPHATE 10 MG/ML IJ SOLN
INTRAMUSCULAR | Status: DC | PRN
  Administered 2018-02-20: 10 mg via INTRAVENOUS

## 2018-02-20 MED ORDER — MIDAZOLAM HCL 2 MG/2ML IJ SOLN
INTRAMUSCULAR | Status: AC
  Filled 2018-02-20: qty 2

## 2018-02-20 SURGICAL SUPPLY — 55 items
BANDAGE ACE 6X5 VEL STRL LF (GAUZE/BANDAGES/DRESSINGS) ×3 IMPLANT
BANDAGE ELASTIC 6 VELCRO ST LF (GAUZE/BANDAGES/DRESSINGS) ×3 IMPLANT
BLADE 11 SAFETY STRL DISP (BLADE) IMPLANT
BLADE CUDA SHAVER 3.5 (BLADE) IMPLANT
BNDG COHESIVE 6X5 TAN NS LF (GAUZE/BANDAGES/DRESSINGS) ×3 IMPLANT
BOOTIES KNEE HIGH SLOAN (MISCELLANEOUS) ×6 IMPLANT
CANNULA ACUFLEX KIT 5X76 (CANNULA) IMPLANT
COVER WAND RF STERILE (DRAPES) ×3 IMPLANT
DRAPE ARTHROSCOPY W/POUCH 114 (DRAPES) ×3 IMPLANT
DRAPE SHEET LG 3/4 BI-LAMINATE (DRAPES) ×3 IMPLANT
DRAPE STERI 35X30 U-POUCH (DRAPES) ×3 IMPLANT
DRSG PAD ABDOMINAL 8X10 ST (GAUZE/BANDAGES/DRESSINGS) ×3 IMPLANT
DURAPREP 26ML APPLICATOR (WOUND CARE) ×3 IMPLANT
ELECT MENISCUS 165MM 90D (ELECTRODE) IMPLANT
ELECT REM PT RETURN 9FT ADLT (ELECTROSURGICAL)
ELECTRODE REM PT RTRN 9FT ADLT (ELECTROSURGICAL) IMPLANT
GAUZE SPONGE 4X4 12PLY STRL (GAUZE/BANDAGES/DRESSINGS) ×2 IMPLANT
GLOVE BIOGEL PI IND STRL 7.0 (GLOVE) ×1 IMPLANT
GLOVE BIOGEL PI IND STRL 7.5 (GLOVE) IMPLANT
GLOVE BIOGEL PI IND STRL 8.5 (GLOVE) IMPLANT
GLOVE BIOGEL PI INDICATOR 7.0 (GLOVE) ×2
GLOVE BIOGEL PI INDICATOR 7.5 (GLOVE) ×2
GLOVE BIOGEL PI INDICATOR 8.5 (GLOVE) ×2
GLOVE INDICATOR 8.5 STRL (GLOVE) ×2 IMPLANT
GLOVE SURG SS PI 7.0 STRL IVOR (GLOVE) ×3 IMPLANT
GLOVE SURG SS PI 8.0 STRL IVOR (GLOVE) ×3 IMPLANT
GOWN STRL REUS W/ TWL XL LVL3 (GOWN DISPOSABLE) ×2 IMPLANT
GOWN STRL REUS W/TWL XL LVL3 (GOWN DISPOSABLE) ×6
IV NS IRRIG 3000ML ARTHROMATIC (IV SOLUTION) ×6 IMPLANT
KIT TURNOVER CYSTO (KITS) ×3 IMPLANT
KNEE WRAP E Z 3 GEL PACK (MISCELLANEOUS) ×3 IMPLANT
MANIFOLD NEPTUNE II (INSTRUMENTS) ×3 IMPLANT
NDL FILTER BLUNT 18X1 1/2 (NEEDLE) ×1 IMPLANT
NDL SAFETY ECLIPSE 18X1.5 (NEEDLE) ×1 IMPLANT
NEEDLE FILTER BLUNT 18X 1/2SAF (NEEDLE) ×2
NEEDLE FILTER BLUNT 18X1 1/2 (NEEDLE) ×1 IMPLANT
NEEDLE HYPO 18GX1.5 SHARP (NEEDLE) ×3
PACK ARTHROSCOPY DSU (CUSTOM PROCEDURE TRAY) ×3 IMPLANT
PACK BASIN DAY SURGERY FS (CUSTOM PROCEDURE TRAY) ×3 IMPLANT
PAD ABD 8X10 STRL (GAUZE/BANDAGES/DRESSINGS) ×2 IMPLANT
PAD CAST 4YDX4 CTTN HI CHSV (CAST SUPPLIES) IMPLANT
PADDING CAST COTTON 4X4 STRL (CAST SUPPLIES) ×3
PADDING CAST COTTON 6X4 STRL (CAST SUPPLIES) ×3 IMPLANT
PROBE BIPOLAR 50 DEGREE SUCT (MISCELLANEOUS) ×3 IMPLANT
RESECTOR FULL RADIUS 4.2MM (BLADE) IMPLANT
SET ARTHROSCOPY TUBING (MISCELLANEOUS) ×3
SET ARTHROSCOPY TUBING LN (MISCELLANEOUS) ×1 IMPLANT
SHAVER 4.2 MM LANZA 9391A (BLADE) ×3 IMPLANT
SUT ETHILON 4 0 PS 2 18 (SUTURE) ×3 IMPLANT
SYR 10ML LL (SYRINGE) ×3 IMPLANT
SYR 30ML LL (SYRINGE) ×3 IMPLANT
TOWEL OR 17X24 6PK STRL BLUE (TOWEL DISPOSABLE) ×3 IMPLANT
TUBE CONNECTING 12'X1/4 (SUCTIONS)
TUBE CONNECTING 12X1/4 (SUCTIONS) IMPLANT
WATER STERILE IRR 500ML POUR (IV SOLUTION) ×3 IMPLANT

## 2018-02-20 NOTE — Op Note (Signed)
NAME: Colin Kelley, MCCORKEL MEDICAL RECORD [id number] ACCOUNT [id number] DATE OF BIRTH:20-Sep-1960 FACILITY: [facility] LOCATION: [facility] PHYSICIAN:Clancy Leiner Windy Kalata, MD  OPERATIVE REPORT  DATE OF PROCEDURE:  02/20/2018  PREOPERATIVE DIAGNOSIS:  Medial meniscus tear, right knee.  POSTOPERATIVE DIAGNOSES: 1.  Medial meniscus tear, lateral meniscus tear, right knee. 2.  Grade II chondromalacia of lateral compartment, patellofemoral joint.  PROCEDURE PERFORMED: 1.  Right knee arthroscopy. 2.  Partial medial and lateral meniscectomy. 3.  Chondroplasty of the lateral tibial plateau, patellofemoral joint, medial femoral condyle.  ANESTHESIA:  General.  ASSISTANT:  Lacie Draft, PA  HISTORY:  A 57 year old locking, giving way, MRI getting meniscus tears, chondromalacia.  Indicated for debridement arthroscopy.  Risks and benefits discussed including bleeding, infection, damage to neurovascular structures, no change in symptoms,  worsening symptoms, DVT, PE, anesthetic complications, etc.  TECHNIQUE:  With the patient in supine position after induction of adequate general anesthesia, 2 g Kefzol, the right lower extremity was prepped and draped in the usual sterile fashion.  Lateral parapatellar portal was fashioned with a #11 blade.   Ingress cannula atraumatically placed.  Irrigant was utilized to insufflate the joint.  Under direct visualization, a medial parapatellar portal was fashioned with a #11 blade after localization with an 18-gauge needle, sparing the medial meniscus.   There was an anterior and a midlateral tear in the medial meniscus.  Introduced a shaver and shaved it to a stable base.  Approximately 20% of the middle third was excised.  The remnant was stable to probe palpation.  Minor grade II and grade III changes  of the femoral condyle.  Light chondroplasty performed here, mainly of the sulcus.  ACL was unremarkable.  Lateral compartment revealed extensive grade II changes in  the lateral tibial plateau and tearing of the lateral meniscus at its root on the posterior third.  Introduced a shaver and shaved it to a stable base.  Further contoured  and debrided with an ArthroWand, particularly at its root.  Light chondroplasty of the tibial plateau and femoral condyle.  The remnant meniscus was stable to probe palpation.  Suprapatellar pouch revealed extensive grade III changes in the patellofemoral joint.  Normal patellofemoral tracking.  Gutters were unremarkable.  Light chondroplasty of the sulcus and of the patella was performed  Revisited all compartments.  No further pathology.  Amenable to arthroscopic intervention, and therefore we removed all instrumentation.  Portals were closed with 4-0 nylon simple sutures.  Marcaine 0.25% with epinephrine was infiltrated in the joint.   The wound was dressed sterilely without difficulty and transported to the recovery room in satisfactory condition.  The patient tolerated the procedure well.  No complications.  Assistant Lacie Draft, [place].  Minimal blood loss.  The assistant was  utilized due to the patient's elevated BMI, he was 39, and to hold his large leg.  LN/NUANCE  D:02/20/2018 T:02/20/2018 JOB:[id number]

## 2018-02-20 NOTE — Anesthesia Procedure Notes (Signed)
Date/Time: 02/20/2018 1:15 PM Performed by: Garrel Ridgel, CRNA Pre-anesthesia Checklist: Patient identified, Emergency Drugs available, Suction available, Patient being monitored and Timeout performed Patient Re-evaluated:Patient Re-evaluated prior to induction Oxygen Delivery Method: Circle system utilized Preoxygenation: Pre-oxygenation with 100% oxygen Induction Type: IV induction Ventilation: Mask ventilation without difficulty LMA Size: 5.0 Number of attempts: 2 Placement Confirmation: positive ETCO2 Tube secured with: Tape Dental Injury: Teeth and Oropharynx as per pre-operative assessment

## 2018-02-20 NOTE — Transfer of Care (Signed)
Immediate Anesthesia Transfer of Care Note  Patient: Colin Kelley  Procedure(s) Performed: Right knee arthroscopy, partial medial and lateral  menisectomy, debridement (Right Knee)  Patient Location: PACU  Anesthesia Type:General  Level of Consciousness: awake, alert  and oriented  Airway & Oxygen Therapy: Patient Spontanous Breathing and Patient connected to face mask oxygen  Post-op Assessment: Report given to RN and Post -op Vital signs reviewed and stable  Post vital signs: Reviewed and stable  Last Vitals:  Vitals Value Taken Time  BP 142/85 02/20/2018  2:02 PM  Temp    Pulse 72 02/20/2018  2:07 PM  Resp 20 02/20/2018  2:07 PM  SpO2 97 % 02/20/2018  2:07 PM  Vitals shown include unvalidated device data.  Last Pain:  Vitals:   02/20/18 1100  TempSrc: Oral  PainSc: 3          Complications: No apparent anesthesia complications

## 2018-02-20 NOTE — Discharge Instructions (Signed)
ARTHROSCOPIC KNEE SURGERY HOME CARE INSTRUCTIONS   PAIN You will be expected to have a moderate amount of pain in the affected knee for approximately two weeks.  However, the first two to four days will be the most severe in terms of the pain you will experience.  Prescriptions have been provided for you to take as needed for the pain.  The pain can be markedly reduced by using the ice/compressive bandage given.  Exchange the ice packs whenever they thaw.  During the night, keep the bandage on because it will still provide some compression for the swelling.  Also, keep the leg elevated on pillows above your heart, and this will help alleviate the pain and swelling.  MEDICATION Prescriptions have been provided to take as needed for pain. To prevent blood clots, take Aspirin 325mg  daily with a meal if not on a blood thinner and if no history of stomach ulcers.  ACTIVITY It is preferred that you stay on bedrest for approximately 24 hours.  However, you may go to the bathroom with help.  After this, you can start to be up and about progressively more.  Remember that the swelling may still increase after three to four days if you are up and doing too much.  You may put as much weight on the affected leg as pain will allow.  Use your crutches for comfort and safety.  However, as soon as you are able, you may discard the crutches and go without them.   DRESSING Keep the current dressing as dry as possible.  Two days after your surgery, you may remove the ice/compressive wrap, and surgical dressing.  You may now take a shower, but do not scrub the sounds directly with soap.  Let water rinse over these and gently wipe with your hand.  Reapply band-aids over the puncture wounds and more gauze if needed.  A slight amount of thin drainage can be normal at this time, and do not let it frighten you.  Reapply the ice/compressive wrap.  You may now repeat this every day each time you shower.  SYMPTOMS TO REPORT TO  YOUR DOCTOR  -Extreme pain.  -Extreme swelling.  -Temperature above 101 degrees that does not come down with acetaminophen     (Tylenol).  -Any changes in the feeling, color or movement of your toes.  -Extreme redness, heat, swelling or drainage at your incision  EXERCISE It is preferred that you begin to exercise on the day of your surgery.  Straight leg raises and short arc quads should be begun the afternoon or evening of surgery and continued until you come back for your follow-up appointment.   Attached is an instruction sheet on how to perform these two simple exercises.  Do these at least three times per day if not more.  You may bend your knee as much as is comfortable.  The puncture wounds may occasionally be slightly uncomfortable with bending of the knee.  Do not let this frighten you.  It is important to keep your knee motion, but do not overdo it.  If you have significant pain, simply do not bend the knee as far.   You will be given more exercises to perform at your first return visit.    RETURN APPOINTMENT Please make an appointment to be seen by your doctor in 10-14 days from your surgery.  Patient Signature:  ________________________________________________________  Nurse's Signature:  ________________________________________________________   Post Anesthesia Home Care Instructions  Activity: Get plenty of  rest for the remainder of the day. A responsible individual must stay with you for 24 hours following the procedure.  For the next 24 hours, DO NOT: -Drive a car -Paediatric nurse -Drink alcoholic beverages -Take any medication unless instructed by your physician -Make any legal decisions or sign important papers.  Meals: Start with liquid foods such as gelatin or soup. Progress to regular foods as tolerated. Avoid greasy, spicy, heavy foods. If nausea and/or vomiting occur, drink only clear liquids until the nausea and/or vomiting subsides. Call your physician if  vomiting continues.  Special Instructions/Symptoms: Your throat may feel dry or sore from the anesthesia or the breathing tube placed in your throat during surgery. If this causes discomfort, gargle with warm salt water. The discomfort should disappear within 24 hours.  If you had a scopolamine patch placed behind your ear for the management of post- operative nausea and/or vomiting:  1. The medication in the patch is effective for 72 hours, after which it should be removed.  Wrap patch in a tissue and discard in the trash. Wash hands thoroughly with soap and water. 2. You may remove the patch earlier than 72 hours if you experience unpleasant side effects which may include dry mouth, dizziness or visual disturbances. 3. Avoid touching the patch. Wash your hands with soap and water after contact with the patch.

## 2018-02-20 NOTE — Anesthesia Preprocedure Evaluation (Addendum)
Anesthesia Evaluation  Patient identified by MRN, date of birth, ID band Patient awake    Reviewed: Allergy & Precautions, NPO status , Patient's Chart, lab work & pertinent test results  History of Anesthesia Complications Negative for: history of anesthetic complications  Airway Mallampati: II  TM Distance: >3 FB Neck ROM: Full    Dental no notable dental hx.    Pulmonary asthma , sleep apnea , COPD, former smoker,    Pulmonary exam normal        Cardiovascular hypertension, Pt. on medications and Pt. on home beta blockers negative cardio ROS Normal cardiovascular exam     Neuro/Psych PSYCHIATRIC DISORDERS Bipolar Disorder negative neurological ROS     GI/Hepatic negative GI ROS, Neg liver ROS,   Endo/Other  Morbid obesity  Renal/GU negative Renal ROS  negative genitourinary   Musculoskeletal negative musculoskeletal ROS (+)   Abdominal (+) + obese,   Peds  Hematology negative hematology ROS (+)   Anesthesia Other Findings   Reproductive/Obstetrics                            Anesthesia Physical Anesthesia Plan  ASA: III  Anesthesia Plan: General   Post-op Pain Management:    Induction: Intravenous  PONV Risk Score and Plan: 2 and Ondansetron, Dexamethasone and Treatment may vary due to age or medical condition  Airway Management Planned: LMA  Additional Equipment: None  Intra-op Plan:   Post-operative Plan: Extubation in OR  Informed Consent: I have reviewed the patients History and Physical, chart, labs and discussed the procedure including the risks, benefits and alternatives for the proposed anesthesia with the patient or authorized representative who has indicated his/her understanding and acceptance.     Plan Discussed with:   Anesthesia Plan Comments:       Anesthesia Quick Evaluation

## 2018-02-20 NOTE — Interval H&P Note (Signed)
History and Physical Interval Note:  02/20/2018 12:48 PM  Colin Kelley  has presented today for surgery, with the diagnosis of right knee degenerative joint disease, medial meniscus tear  The various methods of treatment have been discussed with the patient and family. After consideration of risks, benefits and other options for treatment, the patient has consented to  Procedure(s) with comments: Right knee arthroscopy, partial medial menisectomy, debridement (Right) - 4min as a surgical intervention .  The patient's history has been reviewed, patient examined, no change in status, stable for surgery.  I have reviewed the patient's chart and labs.  Questions were answered to the patient's satisfaction.     Damir Leung C

## 2018-02-20 NOTE — Brief Op Note (Signed)
02/20/2018  1:49 PM  PATIENT:  Colin Kelley  57 y.o. male  PRE-OPERATIVE DIAGNOSIS:  right knee degenerative joint disease, medial meniscus tear  POST-OPERATIVE DIAGNOSIS:  right knee degenerative joint disease, medial meniscus tear  PROCEDURE:  Procedure(s) with comments: Right knee arthroscopy, partial medial and lateral  menisectomy, debridement (Right) - 49min  SURGEON:  Surgeon(s) and Role:    Susa Day, MD - Primary  PHYSICIAN ASSISTANT:   ASSISTANTS: [place]   ANESTHESIA:   general  EBL:  none   BLOOD ADMINISTERED:none  DRAINS: none   LOCAL MEDICATIONS USED:  MARCAINE     SPECIMEN:  No Specimen  DISPOSITION OF SPECIMEN:  N/A  COUNTS:  YES  TOURNIQUET:  * No tourniquets in log *  DICTATION: .Other Dictation: Dictation Number [id number]  PLAN OF CARE: Admit for overnight observation  PATIENT DISPOSITION:  PACU - hemodynamically stable.   Delay start of Pharmacological VTE agent (>24hrs) due to surgical blood loss or risk of bleeding: yes

## 2018-02-23 ENCOUNTER — Encounter (HOSPITAL_BASED_OUTPATIENT_CLINIC_OR_DEPARTMENT_OTHER): Payer: Self-pay | Admitting: Specialist

## 2018-02-23 NOTE — Anesthesia Postprocedure Evaluation (Signed)
Anesthesia Post Note  Patient: Colin Kelley  Procedure(s) Performed: Right knee arthroscopy, partial medial and lateral  menisectomy, debridement (Right Knee)     Patient location during evaluation: PACU Anesthesia Type: General Level of consciousness: awake and alert Pain management: pain level controlled Vital Signs Assessment: post-procedure vital signs reviewed and stable Respiratory status: spontaneous breathing, nonlabored ventilation and respiratory function stable Cardiovascular status: blood pressure returned to baseline and stable Postop Assessment: no apparent nausea or vomiting Anesthetic complications: no    Last Vitals:  Vitals:   02/20/18 1445 02/20/18 1530  BP: 128/84 (!) 133/91  Pulse: 71 68  Resp: 13 14  Temp:  36.6 C  SpO2: 91%     Last Pain:  Vitals:   02/20/18 1515  TempSrc:   PainSc: 0-No pain                 Lidia Collum

## 2018-07-01 ENCOUNTER — Encounter: Payer: Self-pay | Admitting: Physical Therapy

## 2018-07-01 ENCOUNTER — Other Ambulatory Visit: Payer: Self-pay

## 2018-07-01 ENCOUNTER — Ambulatory Visit: Payer: Medicare HMO | Attending: Specialist | Admitting: Physical Therapy

## 2018-07-01 DIAGNOSIS — R262 Difficulty in walking, not elsewhere classified: Secondary | ICD-10-CM | POA: Insufficient documentation

## 2018-07-01 DIAGNOSIS — R600 Localized edema: Secondary | ICD-10-CM | POA: Diagnosis present

## 2018-07-01 DIAGNOSIS — M25561 Pain in right knee: Secondary | ICD-10-CM

## 2018-07-01 NOTE — Therapy (Signed)
[facility] [address] [place], [place], [address] Phone: [phone]   Fax:  [phone]  Physical Therapy Evaluation  Patient Details  Name: Colin Kelley MRN: [id number] Date of Birth: 29-Jun-1960 Referring Provider (PT): Beane   Encounter Date: 07/01/2018  PT End of Session - 07/01/18 1543    Visit Number  1    Date for PT Re-Evaluation  08/31/18    PT Start Time  9983    PT Stop Time  1610    PT Time Calculation (min)  40 min    Activity Tolerance  Patient tolerated treatment well    Behavior During Therapy  [facility] for tasks assessed/performed       Past Medical History:  Diagnosis Date  . Alcohol use disorder, severe, in controlled environment ([place])   . Allergic rhinitis   . Attention deficit disorder without hyperactivity    pt denies  . Bipolar disorder ([place])   . Cancer ([place])   . Cocaine use disorder, severe, dependence ([place])    last used 2014  . 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 Dyslipidemia, goal LDL below 130   . Hypertension   . Hypogonadism male   . OSA (obstructive sleep apnea)   . Spinal stenosis of lumbar region without neurogenic claudication   . Spondylolisthesis   . Substance induced mood disorder [facility])     Past Surgical History:  Procedure Laterality Date  . ankle surgery x 2 left    . KNEE ARTHROSCOPY WITH MEDIAL MENISECTOMY Right 02/20/2018   Procedure: Right knee arthroscopy, partial medial and lateral  menisectomy, debridement;  Surgeon: Susa Day, MD;  Location: [facility];  Service: Orthopedics;  Laterality: Right;  17min  . prostectomy    . SHOULDER SURGERY Right     There were no vitals filed for this visit.   Subjective Assessment - 07/01/18 1528    Subjective  Patient underwent a right knee scope in November, he reports that he had other things he was doing so he waited.  He reports that he feels like he has had fluid drained  off of it recently.  Patient seems to be a poor historian with what has happened.    Limitations  Lifting;Standing;Walking    Patient Stated Goals  walk better, have less pain    Currently in Pain?  Yes    Pain Score  3     Pain Location  Knee    Pain Orientation  Right;Anterior    Pain Descriptors / Indicators  Aching    Pain Type  Chronic pain    Pain Onset  More than a month ago    Pain Frequency  Constant    Aggravating Factors   walking, stairs, lifting pain up to 8/10    Pain Relieving Factors  rest, ice, reports at best pain a 2/10 with some pain medication    Effect of Pain on Daily Activities  difficulty with ADLs         [facility] PT Assessment - 07/01/18 0001      Assessment   Medical Diagnosis  right knee scope November 2019, right knee pain    Referring Provider (PT)  Beane    Onset Date/Surgical Date  03/02/18    Prior Therapy  no      Precautions   Precautions  None      Balance Screen   Has the patient fallen in the past 6  months  No    Has the patient had a decrease in activity level because of a fear of falling?   No    Is the patient reluctant to leave their home because of a fear of falling?   No      Home Environment   Additional Comments  has stairs, does yard work      Prior Function   Level of Insurance underwriter  On disability    Leisure  no exercise      AROM   Overall AROM Comments  pain with knee flexion    AROM Assessment Site  Knee    Right/Left Knee  Right    Right Knee Extension  5    Right Knee Flexion  103      Strength   Overall Strength Comments  right knee 4-/5 with some pain crepitus with resisted extension      Flexibility   Soft Tissue Assessment /Muscle Length  --   tight HS and calves     Palpation   Palpation comment  has mild warmth, mild tender to touch in the patellar tendon area      Ambulation/Gait   Gait Comments  gait is with right toe out, he has antalgic gait on the right, he did struggle  with stairs up and down appeared to be weakness and not pain                Objective measurements completed on examination: See above findings.      [place] [facility] Adult PT Treatment/Exercise - 07/01/18 0001      Exercises   Exercises  Knee/Hip      Knee/Hip Exercises: Aerobic   Nustep  level 4 x 5 minutes             PT Education - 07/01/18 1543    Education provided  Yes    Education Details  QS, SAQ and HS curls    Person(s) Educated  Patient    Methods  Explanation;Demonstration;Verbal cues;Handout    Comprehension  Verbalized understanding       PT Short Term Goals - 07/01/18 1551      PT SHORT TERM GOAL #1   Title  independent with initial HEP    Time  2    Period  Weeks    Status  New        PT Long Term Goals - 07/01/18 1556      PT LONG TERM GOAL #1   Title  increase knee ROM WNL's    Time  8    Period  Weeks    Status  New      PT LONG TERM GOAL #2   Title  report pain decreased 50% for ADL's    Time  8    Period  Weeks    Status  New      PT LONG TERM GOAL #3   Title  increase strength to 4+/5    Time  8    Period  Weeks    Status  New      PT LONG TERM GOAL #4   Title  go up and down stairs step over step without difficulty    Time  8    Period  Weeks    Status  New             Plan - 07/01/18 1544    Clinical Impression Statement  Patient with  right knee scope in November, he reports that he was doing "a lot of other stuff" so he did not have any exercises that he did, he reports that he has had continued pain, has had fluid drained off the knee.  He has some limitation in ROM mostly with flexion, Has crepitus, reports that he may have injections int he future but is unsure when.    Personal Factors and Comorbidities  Time since onset of injury/illness/exacerbation    Examination-Activity Limitations  Lift;Locomotion Level;Carry;Squat;Stairs    Examination-Participation Restrictions  Shop;Cleaning;Yard Work     Stability/Clinical Decision Making  Stable/Uncomplicated    Clinical Decision Making  Low    Rehab Potential  Good    PT Frequency  2x / week    PT Duration  8 weeks    PT Treatment/Interventions  ADLs/Self Care Home Management;Cryotherapy;Electrical Stimulation;Moist Heat;Therapeutic activities;Therapeutic exercise;Neuromuscular re-education;Manual techniques;Patient/family education;Iontophoresis 4mg /ml Dexamethasone;Vasopneumatic Device    PT Next Visit Plan  Slowly start exercise to increase strngth of the knee and work on flexibility    Consulted and Agree with Plan of Care  Patient       Patient will benefit from skilled therapeutic intervention in order to improve the following deficits and impairments:  Abnormal gait, Pain, Decreased mobility, Decreased activity tolerance, Decreased range of motion, Decreased strength, Impaired flexibility, Difficulty walking, Increased edema  Visit Diagnosis: Acute pain of right knee - Plan: PT plan of care cert/re-cert  Difficulty in walking, not elsewhere classified - Plan: PT plan of care cert/re-cert  Localized edema - Plan: PT plan of care cert/re-cert     Problem List Patient Active Problem List   Diagnosis Date Noted  . Pressure injury of skin 12/11/2016  . Acute metabolic encephalopathy 48/54/6270  . Acute renal failure (ARF) ([place]) 12/09/2016  . Lithium toxicity 12/08/2016    Sumner Boast., PT 07/01/2018, 3:59 PM  [address] [place], [place], [address] Phone: [phone]   Fax:  [phone]  Name: Colin Kelley MRN: [id number] Date of Birth: [birth year]

## 2018-07-08 ENCOUNTER — Ambulatory Visit: Payer: Medicare HMO | Admitting: Physical Therapy

## 2019-01-20 ENCOUNTER — Ambulatory Visit: Payer: Medicare HMO | Attending: Specialist | Admitting: Physical Therapy

## 2019-01-20 ENCOUNTER — Other Ambulatory Visit: Payer: Self-pay

## 2019-01-20 DIAGNOSIS — M25561 Pain in right knee: Secondary | ICD-10-CM | POA: Insufficient documentation

## 2019-01-20 DIAGNOSIS — R262 Difficulty in walking, not elsewhere classified: Secondary | ICD-10-CM | POA: Diagnosis present

## 2019-01-20 DIAGNOSIS — M25611 Stiffness of right shoulder, not elsewhere classified: Secondary | ICD-10-CM | POA: Insufficient documentation

## 2019-01-20 NOTE — Therapy (Signed)
[place], [place], [address] Phone: [phone]   Fax:  [phone]  Physical Therapy Evaluation  Patient Details  Name: Colin Kelley MRN: [id number] Date of Birth: Jun 25, 1960 Referring Provider (PT): Susa Day   Encounter Date: 01/20/2019  PT End of Session - 01/20/19 1431    Visit Number  1    Number of Visits  12    Date for PT Re-Evaluation  08/31/18    PT Start Time  [id number]    PT Stop Time  1429    PT Time Calculation (min)  40 min    Activity Tolerance  Patient tolerated treatment well    Behavior During Therapy  [facility] for tasks assessed/performed       Past Medical History:  Diagnosis Date  . Alcohol use disorder, severe, in controlled environment ([place])   . Allergic rhinitis   . Attention deficit disorder without hyperactivity    pt denies  . Bipolar disorder ([place])   . Cancer ([place])   . Cocaine use disorder, severe, dependence ([place])    last used 2014  . 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 Dyslipidemia, goal LDL below 130   . Hypertension   . Hypogonadism male   . OSA (obstructive sleep apnea)   . Spinal stenosis of lumbar region without neurogenic claudication   . Spondylolisthesis   . Substance induced mood disorder [facility])     Past Surgical History:  Procedure Laterality Date  . ankle surgery x 2 left    . KNEE ARTHROSCOPY WITH MEDIAL MENISECTOMY Right 02/20/2018   Procedure: Right knee arthroscopy, partial medial and lateral  menisectomy, debridement;  Surgeon: Susa Day, MD;  Location: [facility];  Service: Orthopedics;  Laterality: Right;  64min  . prostectomy    . SHOULDER SURGERY Right     There were no vitals filed for this visit.   Subjective Assessment - 01/20/19 1356    Subjective  Patient hurt his knee walking upstairs about 6 months ago. Then he hurt knee again in mud 2-3 months ago. The knee is stiff and swollen  and he can't straighten it fully or keep up with walking with other people.    Pertinent History  knee scope Nov 2019, spinal steonsis, spondylolisthesis, HTN, substance abuse    Limitations  Standing;Walking    How long can you stand comfortably?  3 min    How long can you walk comfortably?  15 min    Patient Stated Goals  walk better, have less pain    Currently in Pain?  Yes    Pain Score  7     Pain Location  Knee    Pain Orientation  Right    Pain Descriptors / Indicators  Burning    Pain Type  Acute pain    Pain Onset  More than a month ago    Pain Frequency  Intermittent    Aggravating Factors   walking    Pain Relieving Factors  changing positions    Effect of Pain on Daily Activities  difficulty with ADzls         [facility] PT Assessment - 01/20/19 0001      Assessment   Medical Diagnosis  right knee OA    Referring Provider (PT)  Susa Day    Onset Date/Surgical Date  10/20/18    Prior Therapy  no      Precautions  Precautions  None      Balance Screen   Has the patient fallen in the past 6 months  No    Has the patient had a decrease in activity level because of a fear of falling?   No    Is the patient reluctant to leave their home because of a fear of falling?   No      Home Environment   Additional Comments  has stairs      Prior Function   Level of Independence  Independent    Vocation  On disability    Vocation Requirements  some ladder work; Ambulance person labor    Leisure  no exercise      Posture/Postural Control   Posture/Postural Control  Postural limitations    Posture Comments  stands in right knee flexion with decreased WB on RLE      ROM / Strength   AROM / PROM / Strength  AROM;Strength      AROM   Overall AROM Comments  excessive Right hip ER    AROM Assessment Site  Knee    Right/Left Knee  Right    Right Knee Extension  -6    Right Knee Flexion  103      PROM   PROM Assessment Site  Knee    Right/Left Shoulder  Right    Right/Left  Knee  Right    Right Knee Extension  -4    Right Knee Flexion  115      Strength   Overall Strength Comments  right knee flex 4/5, ext 5/5; right hip flex 4+, ext 4/5, ABD 5/5, Left hip and knee 5/5      Flexibility   Soft Tissue Assessment /Muscle Length  yes    Hamstrings  mod tighness Rt    Quadriceps  right quad tightness    ITB  WNL    Piriformis  tight on right      Palpation   Patella mobility  decreased med/lat with pain reported    Palpation comment  distal quad and medial knee      Special Tests    Special Tests  Knee Special Tests    Other special tests  Valgus stress MCL +, neg Apley compression                Objective measurements completed on examination: See above findings.              PT Education - 01/20/19 1428    Education provided  Yes    Education Details  [email or web address]    Person(s) Educated  Patient    Methods  Explanation;Demonstration;Handout    Comprehension  Returned demonstration;Verbalized understanding       PT Short Term Goals - 01/20/19 1444      PT SHORT TERM GOAL #1   Title  independent with initial HEP    Time  2    Period  Weeks    Status  New        PT Long Term Goals - 01/20/19 1444      PT LONG TERM GOAL #1   Title  increase knee ROM WNL's    Time  4    Period  Weeks    Status  New    Target Date  02/17/19      PT LONG TERM GOAL #2   Title  report pain decreased 50% for ADL's    Time  4    Period  Weeks    Status  New      PT LONG TERM GOAL #3   Title  increase strength to 5/5    Time  4    Period  Weeks    Status  New      PT LONG TERM GOAL #4   Title  able to stand with equal WBing for 10 min or longer    Time  4    Period  Weeks    Status  New      PT LONG TERM GOAL #5   Title  Patient able to walk and keep up with others at normal pace.    Time  4    Period  Weeks    Status  New             Plan - 01/20/19 1438    Clinical Impression Statement  Patient presents with c/o  of right knee pain starting 3 months ago when standing and moving in mud. He also reports he hurt the knee climbing stairs about 6 months ago, but then it resoved. He has decreased ROM in the knee and stands in knee flexion with decreased WB on the RLE. He also has decreased strength in the RLE. Posturally, he stands and lies in excessive right hip rotation and reports pain in knee when his leg is properly aligned. Pain and ROM deficits are affecting ADLS including standing, walking and stairs.    Personal Factors and Comorbidities  Fitness;Comorbidity 3+;Behavior Pattern    Comorbidities  knee scope Nov 2019, spinal steonsis, spondylolisthesis, HTN, substance abuse    Examination-Activity Limitations  Stairs;Stand;Squat    Examination-Participation Restrictions  Yard Work    Stability/Clinical Decision Making  Stable/Uncomplicated    Clinical Decision Making  Low    Rehab Potential  Good    PT Frequency  3x / week    PT Duration  4 weeks    PT Treatment/Interventions  ADLs/Self Care Home Management;Cryotherapy;Electrical Stimulation;Moist Heat;Therapeutic activities;Therapeutic exercise;Neuromuscular re-education;Manual techniques;Patient/family education;Iontophoresis 4mg /ml Dexamethasone;Vasopneumatic Device;Ultrasound;Dry needling    PT Next Visit Plan  Slowly start exercise to increase strength of the knee and work on flexibility    PT [place] ( seated QS, heel slides and SLR)    Consulted and Agree with Plan of Care  Patient       Patient will benefit from skilled therapeutic intervention in order to improve the following deficits and impairments:  Abnormal gait, Pain, Decreased mobility, Decreased activity tolerance, Decreased range of motion, Decreased strength, Impaired flexibility, Difficulty walking  Visit Diagnosis: Acute pain of right knee - Plan: PT plan of care cert/re-cert  Stiffness of right shoulder, not elsewhere classified - Plan: PT plan of care  cert/re-cert  Difficulty in walking, not elsewhere classified - Plan: PT plan of care cert/re-cert     Problem List Patient Active Problem List   Diagnosis Date Noted  . Pressure injury of skin 12/11/2016  . Acute metabolic encephalopathy [id number]  . Acute renal failure (ARF) ([place]) 12/09/2016  . Lithium toxicity 12/08/2016    Madelyn Flavors PT 01/20/2019, 2:50 PM  [place] [address] [place], [place], [address] Phone: [phone]   Fax:  [phone]  Name: Colin Kelley MRN: [id number] Date of Birth: 05/27/1960

## 2019-01-20 NOTE — Patient Instructions (Signed)
Access Code: [email or web address]  URL: [email or web address]  Date: 01/20/2019  Prepared by: Madelyn Flavors   Exercises  Seated Quad Set - 10 reps - 3 sets - 5 sec hold - 1x daily - 7x weekly  Supine Heel Slide - 10 reps - 3 sets - 1x daily - 7x weekly  Supine Active Straight Leg Raise - 10 reps - 3 sets - 1x daily - 7x weekly

## 2019-01-25 ENCOUNTER — Encounter: Payer: Medicare HMO | Admitting: Physical Therapy

## 2020-07-15 ENCOUNTER — Emergency Department (HOSPITAL_COMMUNITY)
Admission: EM | Admit: 2020-07-15 | Discharge: 2020-07-16 | Disposition: A | Discharge status: Home / Self Care | Payer: Medicare Other | Attending: Emergency Medicine | Admitting: Emergency Medicine

## 2020-07-15 ENCOUNTER — Encounter (HOSPITAL_COMMUNITY): Payer: Self-pay

## 2020-07-15 ENCOUNTER — Emergency Department (HOSPITAL_COMMUNITY): Payer: Medicare Other

## 2020-07-15 DIAGNOSIS — Z859 Personal history of malignant neoplasm, unspecified: Secondary | ICD-10-CM | POA: Insufficient documentation

## 2020-07-15 DIAGNOSIS — I10 Essential (primary) hypertension: Secondary | ICD-10-CM | POA: Insufficient documentation

## 2020-07-15 DIAGNOSIS — Y92410 Unspecified street and highway as the place of occurrence of the external cause: Secondary | ICD-10-CM | POA: Diagnosis not present

## 2020-07-15 DIAGNOSIS — Z87891 Personal history of nicotine dependence: Secondary | ICD-10-CM | POA: Diagnosis not present

## 2020-07-15 DIAGNOSIS — Z23 Encounter for immunization: Secondary | ICD-10-CM | POA: Diagnosis not present

## 2020-07-15 DIAGNOSIS — S6992XA Unspecified injury of left wrist, hand and finger(s), initial encounter: Secondary | ICD-10-CM | POA: Diagnosis present

## 2020-07-15 DIAGNOSIS — Z7982 Long term (current) use of aspirin: Secondary | ICD-10-CM | POA: Insufficient documentation

## 2020-07-15 DIAGNOSIS — S61225A Laceration with foreign body of left ring finger without damage to nail, initial encounter: Secondary | ICD-10-CM | POA: Diagnosis not present

## 2020-07-15 DIAGNOSIS — S80212A Abrasion, left knee, initial encounter: Secondary | ICD-10-CM | POA: Insufficient documentation

## 2020-07-15 DIAGNOSIS — Z79899 Other long term (current) drug therapy: Secondary | ICD-10-CM | POA: Insufficient documentation

## 2020-07-15 DIAGNOSIS — T07XXXA Unspecified multiple injuries, initial encounter: Secondary | ICD-10-CM

## 2020-07-15 MED ORDER — TETANUS-DIPHTH-ACELL PERTUSSIS 5-2.5-18.5 LF-MCG/0.5 IM SUSY
0.5000 mL | PREFILLED_SYRINGE | Freq: Once | INTRAMUSCULAR | Status: AC
  Administered 2020-07-15: 0.5 mL via INTRAMUSCULAR
  Filled 2020-07-15: qty 0.5
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
## 2020-07-16 MED ORDER — LIDOCAINE HCL 2 % IJ SOLN
5.0000 mL | Freq: Once | INTRAMUSCULAR | Status: AC
  Administered 2020-07-16: 100 mg via INTRADERMAL
  Filled 2020-07-16: qty 20

## 2020-07-16 MED ORDER — HYDROCODONE-ACETAMINOPHEN 5-325 MG PO TABS
1.0000 | ORAL_TABLET | Freq: Once | ORAL | Status: AC
  Administered 2020-07-16: 1 via ORAL
  Filled 2020-07-16: qty 1

## 2020-07-16 MED ORDER — CEPHALEXIN 250 MG PO CAPS: 250.0000 mg | ORAL_CAPSULE | Freq: Three times a day (TID) | ORAL | 0 refills | Status: AC

## 2020-07-16 NOTE — ED Notes (Signed)
Pt's wound cleaned and irrigated.

## 2020-07-16 NOTE — ED Notes (Signed)
Pt's right lower arm and hand wrapped with xeroform and gauze.
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
## 2020-07-16 NOTE — ED Notes (Signed)
Pt ambulated out of the ED without difficulty with his father.

## 2020-07-28 ENCOUNTER — Encounter (HOSPITAL_COMMUNITY): Payer: Self-pay | Admitting: Emergency Medicine

## 2020-07-28 ENCOUNTER — Emergency Department (HOSPITAL_COMMUNITY)
Admission: EM | Admit: 2020-07-28 | Discharge: 2020-07-28 | Disposition: A | Discharge status: Home / Self Care | Payer: Medicare Other | Attending: Emergency Medicine | Admitting: Emergency Medicine

## 2020-07-28 ENCOUNTER — Other Ambulatory Visit: Payer: Self-pay

## 2020-07-28 DIAGNOSIS — Z79899 Other long term (current) drug therapy: Secondary | ICD-10-CM | POA: Diagnosis not present

## 2020-07-28 DIAGNOSIS — S61215D Laceration without foreign body of left ring finger without damage to nail, subsequent encounter: Secondary | ICD-10-CM | POA: Insufficient documentation

## 2020-07-28 DIAGNOSIS — Z87891 Personal history of nicotine dependence: Secondary | ICD-10-CM | POA: Insufficient documentation

## 2020-07-28 DIAGNOSIS — Z4802 Encounter for removal of sutures: Secondary | ICD-10-CM | POA: Insufficient documentation

## 2020-07-28 DIAGNOSIS — I10 Essential (primary) hypertension: Secondary | ICD-10-CM | POA: Insufficient documentation

## 2020-07-28 DIAGNOSIS — Z859 Personal history of malignant neoplasm, unspecified: Secondary | ICD-10-CM | POA: Diagnosis not present

## 2020-07-28 DIAGNOSIS — S6992XD Unspecified injury of left wrist, hand and finger(s), subsequent encounter: Secondary | ICD-10-CM | POA: Diagnosis present

## 2020-07-28 DIAGNOSIS — Z7982 Long term (current) use of aspirin: Secondary | ICD-10-CM | POA: Diagnosis not present

## 2020-07-28 NOTE — ED Provider Notes (Signed)
[place] EMERGENCY DEPARTMENT Provider Note   CSN: [id number] Arrival date & time: 07/28/20  1654     History Chief Complaint  Patient presents with  . Suture / Staple Removal    Colin Kelley is a 60 y.o. male.  The history is provided by the patient.  Suture / Staple Removal This is a new problem. Episode onset: 07/15/20. The problem occurs constantly. The problem has been gradually improving. Pertinent negatives include no chest pain, no abdominal pain, no headaches and no shortness of breath. Associated symptoms comments: No numbness. Nothing aggravates the symptoms. Nothing relieves the symptoms. Treatments tried: soap and water. The treatment provided moderate relief.       Past Medical History:  Diagnosis Date  . Alcohol use disorder, severe, in controlled environment ([place])   . Allergic rhinitis   . Attention deficit disorder without hyperactivity    pt denies  . Bipolar disorder ([place])   . Cancer ([place])   . Cocaine use disorder, severe, dependence ([place])    last used 2014  . 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 Dyslipidemia, goal LDL below 130   . Hypertension   . Hypogonadism male   . OSA (obstructive sleep apnea)   . Spinal stenosis of lumbar region without neurogenic claudication   . Spondylolisthesis   . Substance induced mood disorder [facility])     Patient Active Problem List   Diagnosis Date Noted  . Pressure injury of skin 12/11/2016  . Acute metabolic encephalopathy 46/56/8127  . Acute renal failure (ARF) ([place]) 12/09/2016  . Lithium toxicity 12/08/2016    Past Surgical History:  Procedure Laterality Date  . ankle surgery x 2 left    . KNEE ARTHROSCOPY WITH MEDIAL MENISECTOMY Right 02/20/2018   Procedure: Right knee arthroscopy, partial medial and lateral  menisectomy, debridement;  Surgeon: Susa Day, MD;  Location: [place];  Service: Orthopedics;  Laterality: Right;  77min  . prostectomy     . SHOULDER SURGERY Right        No family history on file.  Social History   Tobacco Use  . Smoking status: Former Smoker    Packs/day: 1.50    Years: 10.00    Pack years: 15.00    Types: Cigarettes    Quit date: 09/13/2014    Years since quitting: 5.8  . Smokeless tobacco: Former Systems developer    Types: Secondary school teacher  . Vaping Use: Never used  Substance Use Topics  . Alcohol use: No    Comment: no alcohol since 2009  . Drug use: No    Comment: cocaine last used 2014    Home Medications Prior to Admission medications   Medication Sig Start Date End Date Taking? Authorizing Provider  albuterol (PROAIR HFA) 108 (90 Base) MCG/ACT inhaler Inhale 2 puffs into the lungs every 6 (six) hours as needed. Shortness of breath 05/29/11   [provider]  aspirin EC 81 MG tablet Take 81 mg by mouth daily.    [provider]  atorvastatin (LIPITOR) 20 MG tablet Take 20 mg by mouth daily. 12/01/16   [provider]  azelastine (ASTELIN) 0.1 % nasal spray Place 2 sprays into both nostrils daily. 07/11/15   [provider]  azelastine (OPTIVAR) 0.05 % ophthalmic solution Place 2 drops into both eyes daily.  11/17/16   [provider]  cetirizine (ZYRTEC) 10 MG tablet Take 10 mg by mouth daily as needed for allergies.  [provider]  fluticasone (FLONASE) 50 MCG/ACT nasal spray Place 1 spray into both nostrils daily as needed for allergies or rhinitis.    [provider]  fluticasone furoate-vilanterol (BREO ELLIPTA) 100-25 MCG/INH AEPB Inhale 1 puff into the lungs daily. 07/11/15   [provider]  gabapentin (NEURONTIN) 400 MG capsule Take 1 capsule (400 mg total) by mouth 2 (two) times daily. 12/12/16   Rama, Venetia Maxon, MD  lamoTRIgine (LAMICTAL) 100 MG tablet Take 1 tablet (100 mg total) by mouth daily. 12/12/16   Rama, Venetia Maxon, MD  lisinopril (PRINIVIL,ZESTRIL) 40 MG tablet Take 40 mg by mouth daily. 09/05/15 09/04/16   [provider]  propranolol ER (INDERAL LA) 60 MG 24 hr capsule Take 60 mg by mouth daily. 12/06/16   [provider]  QUEtiapine (SEROQUEL XR) 300 MG 24 hr tablet Take 300 mg by mouth at bedtime.  11/30/16   [provider]  traZODone (DESYREL) 50 MG tablet Take 1 tablet (50 mg total) by mouth at bedtime as needed for sleep. 12/12/16   Rama, Venetia Maxon, MD    Allergies    Shellfish-derived products, Fructose, and Sulfa antibiotics  Review of Systems   Review of Systems  Constitutional: Negative for chills and fever.  HENT: Negative for ear pain and sore throat.   Eyes: Negative for pain and visual disturbance.  Respiratory: Negative for cough and shortness of breath.   Cardiovascular: Negative for chest pain and palpitations.  Gastrointestinal: Negative for abdominal pain and vomiting.  Genitourinary: Negative for dysuria and hematuria.  Musculoskeletal: Negative for arthralgias and back pain.  Skin: Negative for color change and rash.  Neurological: Negative for seizures, syncope and headaches.  All other systems reviewed and are negative.   Physical Exam Updated Vital Signs BP 116/70 (BP Location: Right Arm)   Pulse 78   Temp 98.7 F (37.1 C)   Resp 16   SpO2 100%   Physical Exam Vitals and nursing note reviewed.  Constitutional:      Appearance: Normal appearance.  HENT:     Head: Normocephalic and atraumatic.  Eyes:     Conjunctiva/sclera: Conjunctivae normal.  Pulmonary:     Effort: Pulmonary effort is normal. No respiratory distress.  Musculoskeletal:        General: No deformity. Normal range of motion.     Cervical back: Normal range of motion.  Skin:    General: Skin is warm and dry.     Comments: The laceration on the affected finger has dehisced.  It appears to be healing by secondary intention without any evidence of secondary infection.  He does have quite a bit of scabbing on the lateral aspect of the hand where he must of have  some abrasions.  The affected finger has full tendon strength, normal sensation, and no other significant abnormalities.  Neurological:     General: No focal deficit present.     Mental Status: He is alert and oriented to person, place, and time. Mental status is at baseline.  Psychiatric:        Mood and Affect: Mood normal.     ED Results / Procedures / Treatments   Labs (all labs ordered are listed, but only abnormal results are displayed) Labs Reviewed - No data to display  EKG None  Radiology No results found.  Procedures Procedures   Medications Ordered in ED Medications - No data to display  ED Course  I have reviewed the triage vital signs and  the nursing notes.  Pertinent labs & imaging results that were available during my care of the patient were reviewed by me and considered in my medical decision making (see chart for details).    MDM Rules/Calculators/A&P                          The sutures were removed.  The patient's wound will heal by secondary intention.  He was given instructions on wound care. Final Clinical Impression(s) / ED Diagnoses Final diagnoses:  Visit for suture removal    Rx / DC Orders ED Discharge Orders    None       Arnaldo Natal, MD 07/28/20 1756

## 2020-07-28 NOTE — ED Notes (Signed)
Patient given discharge paperwork and instructions. Verbalized understanding of teaching. No IV access. Ambulatory to exit in NAD with steady gait. 
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
## 2021-02-02 ENCOUNTER — Other Ambulatory Visit: Payer: Self-pay | Admitting: Orthopedic Surgery

## 2021-02-02 DIAGNOSIS — M25562 Pain in left knee: Secondary | ICD-10-CM

## 2021-03-10 ENCOUNTER — Ambulatory Visit
Admission: RE | Admit: 2021-03-10 | Discharge: 2021-03-10 | Disposition: A | Discharge status: Home / Self Care | Payer: Medicare Other | Source: Ambulatory Visit | Attending: Orthopedic Surgery | Admitting: Orthopedic Surgery

## 2021-03-10 ENCOUNTER — Other Ambulatory Visit: Payer: Self-pay

## 2021-03-10 DIAGNOSIS — M25562 Pain in left knee: Secondary | ICD-10-CM

## 2021-03-29 ENCOUNTER — Other Ambulatory Visit: Payer: Medicare Other

## 2021-03-31 ENCOUNTER — Other Ambulatory Visit: Payer: Medicare Other

## 2021-04-03 ENCOUNTER — Other Ambulatory Visit: Payer: Self-pay

## 2021-04-03 ENCOUNTER — Ambulatory Visit
Admission: RE | Admit: 2021-04-03 | Discharge: 2021-04-03 | Disposition: A | Discharge status: Home / Self Care | Payer: Medicare Other | Source: Ambulatory Visit | Attending: Orthopedic Surgery | Admitting: Orthopedic Surgery

## 2021-06-01 ENCOUNTER — Other Ambulatory Visit: Payer: Self-pay

## 2021-06-01 ENCOUNTER — Ambulatory Visit (INDEPENDENT_AMBULATORY_CARE_PROVIDER_SITE_OTHER): Payer: Medicare Other | Admitting: Orthopaedic Surgery

## 2021-06-01 DIAGNOSIS — G8929 Other chronic pain: Secondary | ICD-10-CM

## 2021-06-01 DIAGNOSIS — M25562 Pain in left knee: Secondary | ICD-10-CM

## 2021-06-01 DIAGNOSIS — M222X2 Patellofemoral disorders, left knee: Secondary | ICD-10-CM | POA: Diagnosis not present
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
## 2021-06-26 ENCOUNTER — Other Ambulatory Visit: Payer: Self-pay

## 2021-06-26 ENCOUNTER — Ambulatory Visit (HOSPITAL_BASED_OUTPATIENT_CLINIC_OR_DEPARTMENT_OTHER): Payer: Medicare Other | Attending: Orthopaedic Surgery | Admitting: Physical Therapy

## 2021-06-26 ENCOUNTER — Encounter (HOSPITAL_BASED_OUTPATIENT_CLINIC_OR_DEPARTMENT_OTHER): Payer: Self-pay | Admitting: Physical Therapy

## 2021-06-26 DIAGNOSIS — M25562 Pain in left knee: Secondary | ICD-10-CM | POA: Diagnosis present

## 2021-06-26 DIAGNOSIS — G8929 Other chronic pain: Secondary | ICD-10-CM | POA: Insufficient documentation

## 2021-06-26 DIAGNOSIS — M6281 Muscle weakness (generalized): Secondary | ICD-10-CM | POA: Diagnosis present

## 2021-06-26 DIAGNOSIS — M25561 Pain in right knee: Secondary | ICD-10-CM
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
## 2021-07-12 ENCOUNTER — Ambulatory Visit (HOSPITAL_BASED_OUTPATIENT_CLINIC_OR_DEPARTMENT_OTHER): Payer: Medicare Other | Admitting: Physical Therapy

## 2021-07-12 ENCOUNTER — Encounter (HOSPITAL_BASED_OUTPATIENT_CLINIC_OR_DEPARTMENT_OTHER): Payer: Self-pay | Admitting: Physical Therapy

## 2021-07-12 ENCOUNTER — Other Ambulatory Visit: Payer: Self-pay

## 2021-07-12 DIAGNOSIS — G8929 Other chronic pain: Secondary | ICD-10-CM

## 2021-07-12 DIAGNOSIS — M6281 Muscle weakness (generalized): Secondary | ICD-10-CM

## 2021-07-12 DIAGNOSIS — M25561 Pain in right knee: Secondary | ICD-10-CM | POA: Diagnosis not present

## 2021-07-12 NOTE — Therapy (Signed)
?OUTPATIENT PHYSICAL THERAPY TREATMENT NOTE ? ? ?Patient Name: Colin Kelley ?MRN: [id number] ?DOB:Apr 20, 1961, 61 y.o., male ?Today's Date: 07/12/2021 ? ?PCP: Ann Held, MD ?REFERRING PROVIDER: Ann Held, MD ? ? PT End of Session - 07/12/21 1122   ? ? Visit Number 2   ? Number of Visits 9   ? Date for PT Re-Evaluation 08/24/21   ? Authorization Type UHC MCR   ? PT Start Time 1122   ? PT Stop Time 1210   ? PT Time Calculation (min) 48 min   ? Activity Tolerance Patient tolerated treatment well   ? Behavior During Therapy [facility] for tasks assessed/performed   ? ?  ?  ? ?  ? ? ?Past Medical History:  ?Diagnosis Date  ? Alcohol use disorder, severe, in controlled environment [facility])   ? Allergic rhinitis   ? Attention deficit disorder without hyperactivity   ? pt denies  ? Bipolar disorder ([place])   ? Cancer [facility])   ? Cocaine use disorder, severe, dependence ([place])   ? last used 2014  ? Diaphragmatic hernia without obstruction or gangrene   ? Dyslipidemia, goal LDL below 130   ? Hypertension   ? Hypogonadism male   ? OSA (obstructive sleep apnea)   ? Spinal stenosis of lumbar region without neurogenic claudication   ? Spondylolisthesis   ? Substance induced mood disorder ([place])   ? ?Past Surgical History:  ?Procedure Laterality Date  ? ankle surgery x 2 left    ? KNEE ARTHROSCOPY WITH MEDIAL MENISECTOMY Right 02/20/2018  ? Procedure: Right knee arthroscopy, partial medial and lateral  menisectomy, debridement;  Surgeon: Susa Day, MD;  Location: [place];  Service: Orthopedics;  Laterality: Right;  45mn  ? prostectomy    ? SHOULDER SURGERY Right   ? ?Patient Active Problem List  ? Diagnosis Date Noted  ? Pressure injury of skin 12/11/2016  ? Acute metabolic encephalopathy 031/51/7616 ? Acute renal failure (ARF) ([place] 12/09/2016  ? Lithium toxicity 12/08/2016  ?REFERRING PROVIDER: BVanetta Mulders MD ?  ?REFERRING DIAG: M25.562,G89.29 (ICD-10-CM) - Chronic pain of left knee ?  L knee PTF pain  syndrome  ?Quad and hip strengthening   ?  ?  ?  ?THERAPY DIAG:  ?Chronic pain of right knee ?  ?Chronic pain of left knee ?  ?Muscle weakness (generalized) ?  ?ONSET DATE: acute on chronic ?  ?SUBJECTIVE:  ?  ?SUBJECTIVE STATEMENT: ?Pt reports he has done the exercises from HEP about every other day.  No pain today.  ?  ?PERTINENT HISTORY: ?Surgery on left ankle for fracture, bilateral knee surgery ?  ?PAIN:  ?Are you having pain? No ?NPRS scale: 0/10 ?  ?Pain description: Right knee pops, sharp sometimes  ?Aggravating factors: picking up wood- splitting wood, also hurts back ?Relieving factors: ice ?  ?PRECAUTIONS: None ?  ?WEIGHT BEARING RESTRICTIONS No ?  ?FALLS:  ?Has patient fallen in last 6 months? No, Number of falls: 0 ?  ?LIVING ENVIRONMENT: ?Lives with: parents ?Lives in: House/apartment ?Stairs: Yes; room on second floor ?Has following equipment at home: has a cane but does not use them ?  ?OCCUPATION: part time, split wood, on disability ?  ?PLOF: Independent ?  ?PATIENT GOALS decrease pain, strengthen lower body ?  ?  ?OBJECTIVE:  ?  ?  ?PATIENT SURVEYS:  ?FOTO 48 ?  ?COGNITION: ?         Overall cognitive status: Within functional limits for tasks assessed              ?          ?  SENSATION: ?         A little numbness in Lt ankle ?  ?POSTURE:  ?Decreased lumbar lordosis ?  ?  ?  ?LE AROM/PROM: ?  ?A/PROM Right ?06/26/2021 Left ?06/26/2021  ?Hip flexion      ?Hip extension 0 0  ?Hip abduction      ?Hip adduction      ?Hip internal rotation      ?Hip external rotation      ?Knee flexion      ?Knee extension      ?Ankle dorsiflexion      ?Ankle plantarflexion      ?Ankle inversion      ?Ankle eversion      ? (Blank rows = not tested) ?  ?LE MMT: ?  ?MMT Right ?06/26/2021 Left ?06/26/2021  ?Hip flexion      ?Hip extension 3+ 3+  ?Hip abduction      ?Hip adduction      ?Hip internal rotation      ?Hip external rotation      ?Knee flexion      ?Knee extension      ?Ankle dorsiflexion      ?Ankle plantarflexion       ?Ankle inversion      ?Ankle eversion      ? (Blank rows = not tested) ?  ?  ?  ?GAIT: ?  ?Comments: lacking hip extension bilaterally with incr knee flexion in stance phase. Lacking trunk rotation ?  ?  ?  ?TODAY'S TREATMENT: ?Pt seen for aquatic therapy today.  Treatment took place in water 3.25-4 ft in depth at the [employer] pool. Temp of water was 91?.  Pt entered/exited the pool via stairs independently with bilat rail. ? ?Forward gait for warm up holding noodle/ barbell x 3 ?Holding on to wall: Hip abdct x 10 each leg, cues to decrease height of leg; Hip ext x 10 each; hip flexion x 10 each (with one hand yellow buoy) Heel raises x 15; squats x 10. ?Forward, backward and side stepping with floatation barbell and SBA/CGA from therapist (some posterior LOB with both forward gait / backward gait)- cues for increased hip ext LLE with backward gait. ?Seated on bench in water, feet on blue step: sit to/from stand without UE support x 10 ?Trialed gastroc stretch with heels off of step; unable to attain position despite cues ?Holding onto wall in 3.79f of water - forward step ups x 10 with each leg;  Lateral step ups x 8 with each leg ?Forward/ backward gait with UE support on barbell ? ? ?Pt requires buoyancy for support and to offload joints with strengthening exercises. Viscosity of the water is needed for resistance of strengthening; water current perturbations provides challenge to standing balance unsupported, requiring increased core activation. ? ? ?EVAL: (review of HEP given by Dr BSammuel Hines ?SL hip abd ?Bridges ?SLR neutral & ER ?Qped hip ext and bird dog ?Sit<>stand ?Wall squat ?Lateral step up ?  ?  ?PATIENT EDUCATION:  ?Education details: exercise form/rationale ?  ?Person educated: Patient ?Education method: Explanation, Demonstration, Tactile cues, Verbal cues, and Handouts ?Education comprehension: verbalized understanding, returned demonstration, verbal cues required, tactile cues required,  and needs further education ?  ?  ?HOME EXERCISE PROGRAM: ?HEP printouts from Dr BSammuel Hines?  ?ASSESSMENT: ?  ?CLINICAL IMPRESSION: ?Patient was able to complete 4-5" step up with LLE in 3.8 ft of water without UE support.  He demonstrates some decrease in balance and  coordination; benefits from having therapist in water with him to direct the exercises. Continues to demo decreased hip ext with forward gait, and flexed knees in stance phase.  Goals are ongoing.  ?  ?OBJECTIVE IMPAIRMENTS Abnormal gait, decreased activity tolerance, decreased strength, impaired flexibility, improper body mechanics, postural dysfunction, and pain.  ?  ?ACTIVITY LIMITATIONS occupation and yard work.  ?  ?PERSONAL FACTORS Fitness, Time since onset of injury/illness/exacerbation, and 1 comorbidity: h/o ankle and knee surgeries  are also affecting patient's functional outcome.  ?  ?  ?REHAB POTENTIAL: Good ?  ?CLINICAL DECISION MAKING: Stable/uncomplicated ?  ?EVALUATION COMPLEXITY: Low ?  ?  ?GOALS: ?Goals reviewed with patient? Yes ?  ?SHORT TERM GOALS: ?  ?Able to demo proper form in sit<>stand ?Baseline: rotates to the Left to watch seat ?Target date: 07/17/2021 ?Goal status: INITIAL ?  ?  ?  ?LONG TERM GOALS: ?  ?Pt will demo hip extension prior to toe off in gait ?Baseline:  ends at neutral extension at eval ?Target date: 08/21/2021 ?Goal status: INITIAL ?  ?2.  Pt will demo step up to full knee extension without UE support ?Baseline: unable at eval ?Target date: 08/21/2021 ?Goal status: INITIAL ?  ?3.  Pt will demo proper squat/lift form ?Baseline: reports significant back pain when transferring wood while chopping ?Target date: 08/21/2021 ?Goal status: INITIAL ?  ?4.  Pt will be Independent in long term HEP ?Baseline: will progress as appropriate ?Target date: 08/21/2021 ?Goal status: INITIAL ?  ?PLAN: ?PT FREQUENCY: 1x/week ?  ?PT DURATION: 8 weeks ?  ?PLANNED INTERVENTIONS: Therapeutic exercises, Therapeutic activity, Neuromuscular  re-education, Balance training, Gait training, Patient/Family education, Joint mobilization, Stair training, Aquatic Therapy, Spinal mobilization, Cryotherapy, Moist heat, Taping, and Manual therapy ?  ?PLAN F

## 2021-07-18 ENCOUNTER — Ambulatory Visit (HOSPITAL_BASED_OUTPATIENT_CLINIC_OR_DEPARTMENT_OTHER): Payer: Medicare Other | Admitting: Physical Therapy

## 2021-07-18 ENCOUNTER — Encounter (HOSPITAL_BASED_OUTPATIENT_CLINIC_OR_DEPARTMENT_OTHER): Payer: Self-pay | Admitting: Physical Therapy

## 2021-07-18 DIAGNOSIS — G8929 Other chronic pain: Secondary | ICD-10-CM

## 2021-07-18 DIAGNOSIS — M6281 Muscle weakness (generalized): Secondary | ICD-10-CM

## 2021-07-18 DIAGNOSIS — M25561 Pain in right knee: Secondary | ICD-10-CM | POA: Diagnosis not present

## 2021-07-18 NOTE — Therapy (Addendum)
OUTPATIENT PHYSICAL THERAPY TREATMENT NOTE PHYSICAL THERAPY DISCHARGE SUMMARY  Visits from Start of Care: 3  Current functional level related to goals / functional outcomes: unknown   Remaining deficits: all   Education / Equipment: Management of condition   Patient agrees to discharge. Patient goals were not met. Patient is being discharged due to not returning since the last visit.   Patient Name: Colin Kelley MRN: [id number] DOB:04-25-1960, 61 y.o., male Today's Date: 07/18/2021  PCP: Ann Held, MD REFERRING PROVIDER: Ann Held, MD   PT End of Session - 07/18/21 1521     Visit Number 3    Number of Visits 9    Date for PT Re-Evaluation 08/24/21    Authorization Type UHC MCR    PT Start Time 1509    PT Stop Time 7353    PT Time Calculation (min) 38 min    Activity Tolerance Patient tolerated treatment well    Behavior During Therapy WFL for tasks assessed/performed              Past Medical History:  Diagnosis Date   Alcohol use disorder, severe, in controlled environment ([place])    Allergic rhinitis    Attention deficit disorder without hyperactivity    pt denies   Bipolar disorder ([place])    Cancer ([place])    Cocaine use disorder, severe, dependence ([place])    last used 2014   Diaphragmatic hernia without obstruction or gangrene    Dyslipidemia, goal LDL below 130    Hypertension    Hypogonadism male    OSA (obstructive sleep apnea)    Spinal stenosis of lumbar region without neurogenic claudication    Spondylolisthesis    Substance induced mood disorder ([place])    Past Surgical History:  Procedure Laterality Date   ankle surgery x 2 left     KNEE ARTHROSCOPY WITH MEDIAL MENISECTOMY Right 02/20/2018   Procedure: Right knee arthroscopy, partial medial and lateral  menisectomy, debridement;  Surgeon: Susa Day, MD;  Location: [place];  Service: Orthopedics;  Laterality: Right;  2mn   prostectomy     SHOULDER SURGERY  Right    Patient Active Problem List   Diagnosis Date Noted   Pressure injury of skin 029/92/4268  Acute metabolic encephalopathy 034/19/6222  [place] 12/09[place]   Lithium toxicity 12/08/2016  REFERRING PROVIDER: BVanetta Mulders MD   REFERRING DIAG: [id number](ICD-10-CM) - Chronic pain of left knee   L knee PTF pain syndrome  Quad and hip strengthening         THERAPY DIAG:  Chronic pain of right knee   Chronic pain of left knee   Muscle weakness (generalized)   ONSET DATE: acute on chronic   SUBJECTIVE:    SUBJECTIVE STATEMENT: "Trying to do the exercises daily but I don't get them all done cause there is so many"   PERTINENT HISTORY: Surgery on left ankle for fracture, bilateral knee surgery   PAIN:  Are you having pain? No NPRS scale: 0/10   Pain description: Right knee pops, sharp sometimes  Aggravating factors: picking up wood- splitting wood, also hurts back Relieving factors: ice   PRECAUTIONS: None   WEIGHT BEARING RESTRICTIONS No   FALLS:  Has patient fallen in last 6 months? No, Number of falls: 0   LIVING ENVIRONMENT: Lives with: parents Lives in: House/apartment Stairs: Yes; room on second floor Has following equipment at home: has a cane but does not use  them   OCCUPATION: part time, split wood, on disability   PLOF: Independent   PATIENT GOALS decrease pain, strengthen lower body     OBJECTIVE:      PATIENT SURVEYS:  FOTO 48   COGNITION:          Overall cognitive status: Within functional limits for tasks assessed                        SENSATION:          A little numbness in Lt ankle   POSTURE:  Decreased lumbar lordosis       LE AROM/PROM:   A/PROM Right 06/26/2021 Left 06/26/2021  Hip flexion      Hip extension 0 0  Hip abduction      Hip adduction      Hip internal rotation      Hip external rotation      Knee flexion      Knee extension      Ankle dorsiflexion      Ankle  plantarflexion      Ankle inversion      Ankle eversion       (Blank rows = not tested)   LE MMT:   MMT Right 06/26/2021 Left 06/26/2021  Hip flexion      Hip extension 3+ 3+  Hip abduction      Hip adduction      Hip internal rotation      Hip external rotation      Knee flexion      Knee extension      Ankle dorsiflexion      Ankle plantarflexion      Ankle inversion      Ankle eversion       (Blank rows = not tested)       GAIT:   Comments: lacking hip extension bilaterally with incr knee flexion in stance phase. Lacking trunk rotation       TODAY'S TREATMENT: Pt seen for aquatic therapy today.  Treatment took place in water 3.25-4 ft in depth at the [facility] pool. Temp of water was 91.  Pt entered/exited the pool via stairs independently with bilat rail.  Forward gait for warm up holding noodle/ barbell x 3 Holding on to wall: Hip abdct x 10 each leg; Hip ext x 10 each; hip flexion x 10 each (with one hand yellow buoy); Heel raises and toe raises 2x12 ue support of hand buoys Step ups blue step 4.25f   forward x10 R/L TC for execution Side step ups R/L x10 hoding to wall TC for execution STS x10 bench onto blue step.  Wide BOS. Repeated with small BOS x 10. Cues for immediate standing balance  Mini squats in 318fon blue step x 10. VC and demonstration for execution.   Pt requires buoyancy for support and to offload joints with strengthening exercises. Viscosity of the water is needed for resistance of strengthening; water current perturbations provides challenge to standing balance unsupported, requiring increased core activation.   EVAL: (review of HEP given by Dr BoSammuel HinesSL hip abd Bridges SLR neutral & ER Qped hip ext and bird dog Sit<>stand Wall squat Lateral step up     PATIENT EDUCATION:  Education details: exercise form/rationale   Person educated: Patient Education method: Explanation, Demonstration, Tactile cues, Verbal cues, and  Handouts Education comprehension: verbalized understanding, returned demonstration, verbal cues required, tactile cues required, and needs further education  HOME EXERCISE PROGRAM: HEP printouts from Dr Sammuel Hines   ASSESSMENT:   CLINICAL IMPRESSION: Pt requires continues VC and demonstration throughout session for understanding and improved execution of exercises. Focused on strengthening with balance to improve core control. Cues for erect posture when gaining immediate balance. Resting tremor noticed throughout session. Pt completing all tasks with therapist in water with him. Goals ongoing.     OBJECTIVE IMPAIRMENTS Abnormal gait, decreased activity tolerance, decreased strength, impaired flexibility, improper body mechanics, postural dysfunction, and pain.    ACTIVITY LIMITATIONS occupation and yard work.    PERSONAL FACTORS Fitness, Time since onset of injury/illness/exacerbation, and 1 comorbidity: h/o ankle and knee surgeries  are also affecting patient's functional outcome.      REHAB POTENTIAL: Good   CLINICAL DECISION MAKING: Stable/uncomplicated   EVALUATION COMPLEXITY: Low     GOALS: Goals reviewed with patient? Yes   SHORT TERM GOALS:   Able to demo proper form in sit<>stand Baseline: rotates to the Left to watch seat Target date: 07/17/2021 Goal status: INITIAL       LONG TERM GOALS:   Pt will demo hip extension prior to toe off in gait Baseline:  ends at neutral extension at eval Target date: 08/21/2021 Goal status: INITIAL   2.  Pt will demo step up to full knee extension without UE support Baseline: unable at eval Target date: 08/21/2021 Goal status: INITIAL   3.  Pt will demo proper squat/lift form Baseline: reports significant back pain when transferring wood while chopping Target date: 08/21/2021 Goal status: INITIAL   4.  Pt will be Independent in long term HEP Baseline: will progress as appropriate Target date: 08/21/2021 Goal status:  INITIAL   PLAN: PT FREQUENCY: 1x/week   PT DURATION: 8 weeks   PLANNED INTERVENTIONS: Therapeutic exercises, Therapeutic activity, Neuromuscular re-education, Balance training, Gait training, Patient/Family education, Joint mobilization, Stair training, Aquatic Therapy, Spinal mobilization, Cryotherapy, Moist heat, Taping, and Manual therapy   PLAN FOR NEXT SESSION: determine if POC change is required for injections? Aquatic- glut, quads, core, balance.  Print HEP for patient. Assess response to aquatics.   Stanton Kidney Tharon Aquas) Schelly Chuba MPT 07/18/21 7:10 PM

## 2021-08-09 ENCOUNTER — Ambulatory Visit (HOSPITAL_BASED_OUTPATIENT_CLINIC_OR_DEPARTMENT_OTHER): Payer: Medicare Other | Admitting: Physical Therapy

## 2021-08-16 ENCOUNTER — Ambulatory Visit (HOSPITAL_BASED_OUTPATIENT_CLINIC_OR_DEPARTMENT_OTHER): Payer: Medicare Other | Admitting: Physical Therapy

## 2021-08-23 ENCOUNTER — Ambulatory Visit (HOSPITAL_BASED_OUTPATIENT_CLINIC_OR_DEPARTMENT_OTHER): Payer: Medicare Other | Admitting: Physical Therapy

## 2021-08-29 ENCOUNTER — Ambulatory Visit (HOSPITAL_BASED_OUTPATIENT_CLINIC_OR_DEPARTMENT_OTHER): Payer: Medicare Other | Admitting: Physical Therapy

## 2021-09-06 ENCOUNTER — Ambulatory Visit (HOSPITAL_BASED_OUTPATIENT_CLINIC_OR_DEPARTMENT_OTHER): Payer: Medicare Other | Admitting: Physical Therapy

## 2021-09-13 ENCOUNTER — Encounter (HOSPITAL_COMMUNITY): Payer: Self-pay

## 2021-09-13 ENCOUNTER — Ambulatory Visit (HOSPITAL_BASED_OUTPATIENT_CLINIC_OR_DEPARTMENT_OTHER): Payer: Self-pay | Admitting: Physical Therapy

## 2021-09-13 ENCOUNTER — Emergency Department (HOSPITAL_COMMUNITY)
Admission: EM | Admit: 2021-09-13 | Discharge: 2021-09-13 | Disposition: A | Discharge status: Home / Self Care | Payer: Medicare Other | Attending: Emergency Medicine | Admitting: Emergency Medicine

## 2021-09-13 ENCOUNTER — Other Ambulatory Visit: Payer: Self-pay

## 2021-09-13 DIAGNOSIS — R197 Diarrhea, unspecified: Secondary | ICD-10-CM | POA: Insufficient documentation

## 2021-09-13 DIAGNOSIS — R39198 Other difficulties with micturition: Secondary | ICD-10-CM | POA: Insufficient documentation

## 2021-09-13 DIAGNOSIS — R339 Retention of urine, unspecified: Secondary | ICD-10-CM | POA: Diagnosis present

## 2021-09-13 LAB — CBC
HCT: 43.5 % (ref 39.0–52.0)
Hemoglobin: 14.5 g/dL (ref 13.0–17.0)
MCH: 31.5 pg (ref 26.0–34.0)
MCHC: 33.3 g/dL (ref 30.0–36.0)
MCV: 94.4 fL (ref 80.0–100.0)
Platelets: 201 10*3/uL (ref 150–400)
RBC: 4.61 MIL/uL (ref 4.22–5.81)
RDW: 12.8 % (ref 11.5–15.5)
WBC: 10.5 10*3/uL (ref 4.0–10.5)
nRBC: 0 % (ref 0.0–0.2)

## 2021-09-13 LAB — URINALYSIS, ROUTINE W REFLEX MICROSCOPIC
Bilirubin Urine: NEGATIVE
Glucose, UA: NEGATIVE mg/dL
Hgb urine dipstick: NEGATIVE
Ketones, ur: NEGATIVE mg/dL
Nitrite: NEGATIVE
Protein, ur: 30 mg/dL — AB
Specific Gravity, Urine: 1.016 (ref 1.005–1.030)
WBC, UA: >50 WBC/hpf — ABNORMAL HIGH (ref 0–5)
pH: 5 (ref 5.0–8.0)

## 2021-09-13 LAB — BASIC METABOLIC PANEL
Anion gap: 5 (ref 5–15)
BUN: 15 mg/dL (ref 8–23)
CO2: 23 mmol/L (ref 22–32)
Calcium: 9.2 mg/dL (ref 8.9–10.3)
Chloride: 113 mmol/L — ABNORMAL HIGH (ref 98–111)
Creatinine, Ser: 1 mg/dL (ref 0.61–1.24)
GFR, Estimated: >60 mL/min (ref >60)
Glucose, Bld: 153 mg/dL — ABNORMAL HIGH (ref 70–99)
Potassium: 4.1 mmol/L (ref 3.5–5.1)
Sodium: 141 mmol/L (ref 135–145)

## 2021-09-13 NOTE — ED Triage Notes (Signed)
Pt reports with bladder pain and stating that he can't urinate. Pt states that he is on an abt for a UTI.

## 2021-09-13 NOTE — ED Provider Notes (Signed)
[place] DEPT Provider Note   CSN: [id number] Arrival date & time: 09/13/21  0458     History  Chief Complaint  Patient presents with   Urinary Retention    Colin Kelley is a 61 y.o. male.  The history is provided by the patient and medical records.   61 y.o. M presenting to the ED with urinary retention.  By time of my evaluation, he had diarrheal bowel movement in the waiting room and was able to urinate some.  States he is actually feeling better at this time.  He does not have a prostate so usually does not have any issues with urinary retention.  He is on amoxicillin for a UTI that was prescribed by his primary care doctor.  He denies any fever, chills, or flank pain.  States currently he has no pain and feels better.  Home Medications Prior to Admission medications   Medication Sig Start Date End Date Taking? Authorizing Provider  albuterol (VENTOLIN HFA) 108 (90 Base) MCG/ACT inhaler Inhale 2 puffs into the lungs every 6 (six) hours as needed for shortness of breath. 05/29/11  Yes [provider]  azelastine (ASTELIN) 0.1 % nasal spray Place 2 sprays into both nostrils daily. 07/11/15  Yes [provider]  fluticasone (FLONASE) 50 MCG/ACT nasal spray Place 1 spray into both nostrils daily as needed for allergies or rhinitis.   Yes [provider]  fluticasone furoate-vilanterol (BREO ELLIPTA) 100-25 MCG/INH AEPB Inhale 1 puff into the lungs daily as needed (difficulty breathing). 07/11/15  Yes [provider]  gabapentin (NEURONTIN) 400 MG capsule Take 1 capsule (400 mg total) by mouth 2 (two) times daily. Patient taking differently: Take 900 mg by mouth 3 (three) times daily. 12/12/16  Yes Rama, Venetia Maxon, MD  lamoTRIgine (LAMICTAL) 100 MG tablet Take 1 tablet (100 mg total) by mouth daily. Patient taking differently: Take 150 mg by mouth 2 (two) times daily. 12/12/16  Yes Rama, Venetia Maxon, MD  QUEtiapine  (SEROQUEL XR) 300 MG 24 hr tablet Take 600 mg by mouth at bedtime. 11/30/16  Yes [provider]  atorvastatin (LIPITOR) 20 MG tablet Take 20 mg by mouth daily. 12/01/16   [provider]  ketoconazole (NIZORAL) 2 % shampoo Apply topically. 08/17/21   [provider]  lamoTRIgine (LAMICTAL) 150 MG tablet Take 150 mg by mouth 2 (two) times daily. 08/17/21   [provider]  lithium 300 MG tablet Take by mouth daily. 08/17/21   [provider]  naltrexone (DEPADE) 50 MG tablet Take by mouth. 08/17/21   [provider]  propranolol ER (INDERAL LA) 120 MG 24 hr capsule Take 120 mg by mouth daily. 12/06/16   [provider]      Allergies    Shellfish-derived products, Fructose, and Sulfa antibiotics    Review of Systems   Review of Systems  Genitourinary:  Positive for difficulty urinating.  All other systems reviewed and are negative.  Physical Exam Updated Vital Signs BP 100/63   Pulse 60   Temp 97.7 F (36.5 C) (Oral)   Resp 16   Ht '5\' 7"'$  (1.702 m)   Wt 104.3 kg   SpO2 94%   BMI 36.02 kg/m   Physical Exam Vitals and nursing note reviewed.  Constitutional:      Appearance: He is well-developed.  HENT:     Head: Normocephalic and atraumatic.  Eyes:     Conjunctiva/sclera: Conjunctivae normal.     Pupils:  Pupils are equal, round, and reactive to light.  Cardiovascular:     Rate and Rhythm: Normal rate and regular rhythm.     Heart sounds: Normal heart sounds.  Pulmonary:     Effort: Pulmonary effort is normal.     Breath sounds: Normal breath sounds.  Abdominal:     General: Bowel sounds are normal.     Palpations: Abdomen is soft.     Comments: Soft, non-tender  Musculoskeletal:        General: Normal range of motion.     Cervical back: Normal range of motion.  Skin:    General: Skin is warm and dry.  Neurological:     Mental Status: He is alert and oriented to person, place, and time.    ED Results /  Procedures / Treatments   Labs (all labs ordered are listed, but only abnormal results are displayed) Labs Reviewed  URINALYSIS, ROUTINE W REFLEX MICROSCOPIC - Abnormal; Notable for the following components:      Result Value   APPearance CLOUDY (*)    Protein, ur 30 (*)    Leukocytes,Ua LARGE (*)    WBC, UA >50 (*)    Bacteria, UA RARE (*)    All other components within normal limits  BASIC METABOLIC PANEL - Abnormal; Notable for the following components:   Chloride 113 (*)    Glucose, Bld 153 (*)    All other components within normal limits  URINE CULTURE  CBC    EKG None  Radiology No results found.  Procedures Procedures    Medications Ordered in ED Medications - No data to display  ED Course/ Medical Decision Making/ A&P                           Medical Decision Making Amount and/or Complexity of Data Reviewed Labs: ordered.   61 year old male presenting to the ED with urinary retention.  By time of my evaluation, he had a bowel movement in the lobby and was able to urinate afterwards.  He has no urge to urinate currently and no discomfort.  He is afebrile, nontoxic.  His abdomen is soft and nontender.  There is no distention noted over the bladder and no elicited tenderness with exam.  Labs were sent from triage are overall reassuring without any leukocytosis or signs of acute kidney injury/renal failure.  He was able to produce further urine here in the ED.  Some rare bacteria, will send for culture.  As he is urinating normally now and asymptomatic, will discharge home with continued amoxicillin.  He will be notified if culture results atypical or abx need to be changed.  Can follow-up with PCP.  Return here for new concerns.  Final Clinical Impression(s) / ED Diagnoses Final diagnoses:  Difficulty urinating    Rx / DC Orders ED Discharge Orders     None         Larene Pickett, PA-C 09/13/21 0636    Quintella Reichert, MD 09/13/21 [phone]

## 2021-09-13 NOTE — Discharge Instructions (Signed)
Continue your amoxicillin. You will be notified if culture results are abnormal. Follow-up with your primary care doctor. Return here for new concerns.

## 2021-09-13 NOTE — ED Notes (Signed)
Discharge instructions reviewed, questions answered. Pt states understanding and no further questions. Pt ambulatory with steady gait upon discharge. No s/s of distress noted. Pt provided sandwich and diet cola prior to discharge.

## 2021-09-14 LAB — URINE CULTURE: Culture: <10000 — AB

## 2021-09-20 ENCOUNTER — Ambulatory Visit (HOSPITAL_BASED_OUTPATIENT_CLINIC_OR_DEPARTMENT_OTHER): Payer: Self-pay | Admitting: Physical Therapy

## 2021-09-26 ENCOUNTER — Encounter (HOSPITAL_BASED_OUTPATIENT_CLINIC_OR_DEPARTMENT_OTHER): Payer: Medicare Other | Admitting: Physical Therapy

## 2022-05-22 ENCOUNTER — Ambulatory Visit: Payer: Self-pay | Admitting: Surgery

## 2022-05-22 DIAGNOSIS — Z720 Tobacco use: Secondary | ICD-10-CM

## 2022-05-22 NOTE — H&P (Signed)
Colin Kelley [id number]   Referring Provider:  Greggory Brandy, MD   Subjective   Chief Complaint: New Consultation (Umbilical hernia w/o obstruction or gangrene)     History of Present Illness:    Very pleasant 62 year old male with history of COPD, sleep apnea, anxiety, bipolar disorder, substance abuse (alcohol and cocaine, remote none presently), vitamin D deficiency, prostate cancer status post prostatectomy, current tobacco abuse (about 1 pack/day) presents for evaluation of umbilical hernia.  He has noticed this for some time, but it has started to bother him more recently with discomfort in the area.  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  No episodes of incarceration or strangulation.  He does suffer with chronic constipation and is following up with [facility] to organize a colonoscopy.    Review of Systems: A complete review of systems was obtained from the patient.  I have reviewed this information and discussed as appropriate with the patient.  See HPI as well for other ROS.   Medical History: Past Medical History:  Diagnosis Date   Anxiety    Arthritis    Asthma, unspecified asthma severity, unspecified whether complicated, unspecified whether persistent    COPD (chronic obstructive pulmonary disease) (CMS-HCC)    History of cancer    Hypertension    Sleep apnea     There is no problem list on file for this patient.   Past Surgical History:  Procedure Laterality Date   ankle surgery x 2 left     Knee arthroscopy with medial menisectomy (Right)     prostectomy     Shoulder surgery (Right)       Allergies  Allergen Reactions   Sulfa (Sulfonamide Antibiotics) Rash    Current Outpatient Medications on File Prior to Visit  Medication Sig Dispense Refill   acetylcysteine (NAC) 600 mg capsule Take 600 mg by mouth 3 (three) times daily     amLODIPine (NORVASC) 5 MG tablet Take  by mouth     aspirin 81 MG EC tablet Take 1 tablet by mouth once daily     gabapentin (NEURONTIN) 800 MG tablet TAKE 1 TABLET BY MOUTH IN THE MORNING AND 2 TABLETS BY MOUTH AT BEDTIME     lamoTRIgine (LAMICTAL) 150 MG tablet Take 1 tablet by mouth every morning     lithium carbonate 300 MG ER tablet Take by mouth     naltrexone (REVIA) 50 mg tablet      pravastatin (PRAVACHOL) 40 MG tablet Take 1 tablet by mouth every evening     propranoloL (INDERAL LA) 120 MG 24 hr capsule Take 1 capsule by mouth once daily     QUEtiapine (SEROQUEL) 200 MG tablet Take 200 mg by mouth at bedtime     suvorexant (BELSOMRA) 20 mg tablet Take 20 mg by mouth at bedtime as needed     No current facility-administered medications on file prior to visit.    Family History  Problem Relation Age of Onset   Stroke Father      Social History   Tobacco Use  Smoking Status Every Day   Types: Cigarettes  Smokeless Tobacco Never     Social History   Socioeconomic History   Marital status: Single  Tobacco Use   Smoking status: Every Day    Types: Cigarettes   Smokeless tobacco: Never  Vaping Use   Vaping Use: Never used  Substance and Sexual Activity  Alcohol use: Not Currently   Drug use: Not Currently    Objective:    Vitals:   05/22/22 1455  BP: (!) 148/82  Pulse: 67  Temp: 36.4 C (97.6 F)  SpO2: 96%  Weight: 100.7 kg (222 lb)  Height: 170.2 cm ('5\' 7"'$ )  PainSc:   4  PainLoc: Abdomen    Body mass index is 34.77 kg/m.  Gen: A&Ox3, no distress  Unlabored respiration Abdomen is obese, soft, nontender.  Partially reducible umbilical hernia.  Assessment and Plan:  Diagnoses and all orders for this visit:  Umbilical hernia without obstruction and without gangrene  I recommend an open repair, likely with mesh.  We discussed the relevant anatomy and details of the procedure.  Discussed risks of bleeding, infection, pain, scarring, injury to intra-abdominal structures, hematoma/seroma,  poor wound healing or wound infection, hernia recurrence, as well as cardiovascular/pulmonary/thromboembolic risks.  Discussed that his risk of recurrence or wound healing problems/infection is higher due to tobacco abuse.  Encouraged patient to at least cut back on smoking prior to surgery and went over the rationale for this.  Given the size of the defect, it is at the point now where I do worry about the possibility of bowel incarceration and I think better to proceed despite ongoing nicotine use at this point.  We will request cardiac clearance, and patient would like to proceed with surgery.  Meranda Dechaine Raquel James, MD

## 2022-07-21 IMAGING — MR MR KNEE*L* W/O CM
7 series · 40 of 40 positions shown · non-contrast
Comparison: X-ray knee 07/16/2020.

CLINICAL DATA: Generalized left knee pain for 10 months.

EXAM:
MRI OF THE LEFT KNEE WITHOUT CONTRAST
TECHNIQUE: Multiplanar, multisequence MR imaging of the knee was performed. No
intravenous contrast was administered.

[Series 6: T2 fat-sat · axial · left · 4.0mm · 0.50mm/px · z∈[-139,+14]mm · 6 of 36 slices shown (1 of 3)]
[im 1/36]
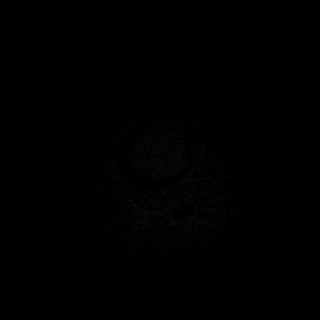
[im 8/36]
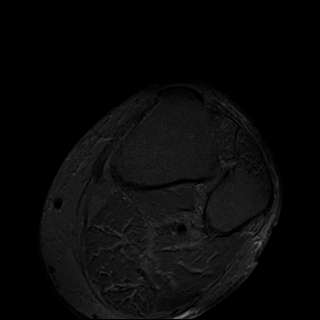
[im 15/36]
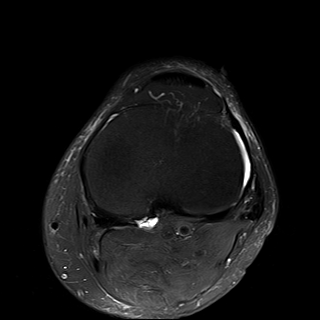
[im 22/36]
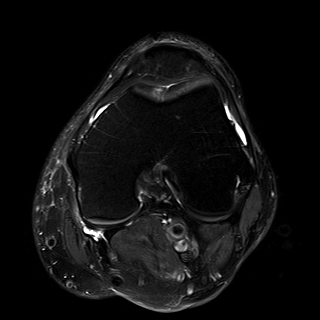
[im 29/36]
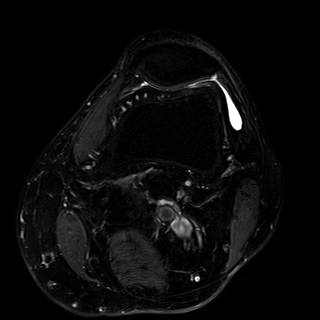
[im 36/36]
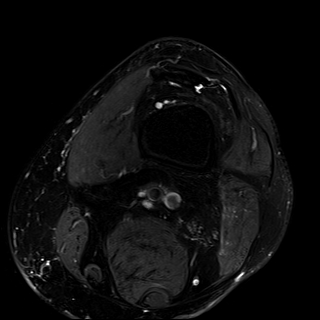

[Series 7: T2 fat-sat · coronal · left · 4.0mm · 0.47mm/px · 6 of 28 slices shown (2 of 3)]
[im 1/28]
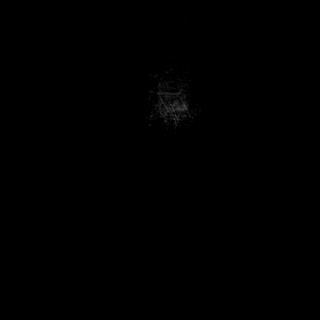
[im 6/28]
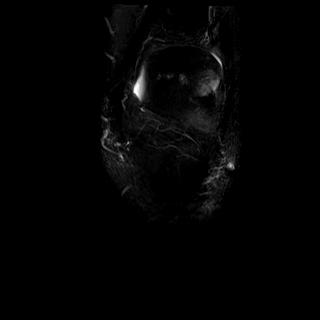
[im 11/28]
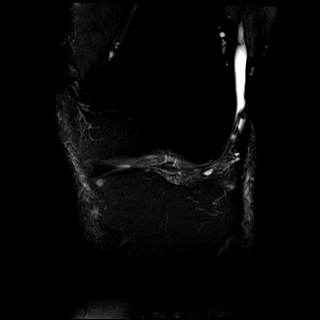
[im 17/28]
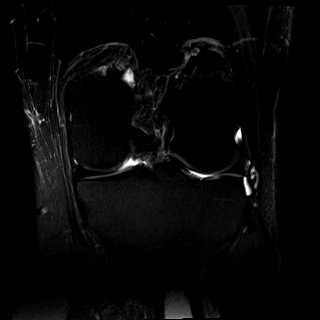
[im 22/28]
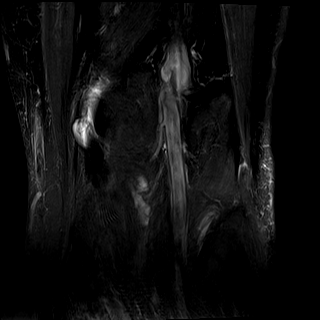
[im 28/28]
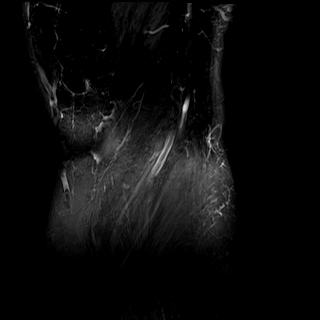

[Series 8: T1 · coronal · left · 4.0mm · 0.47mm/px · 6 of 28 slices shown]
[im 1/28]
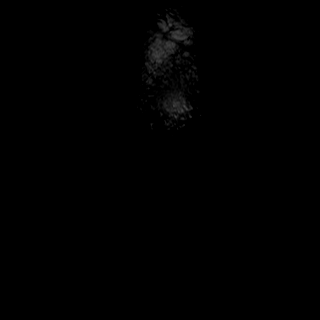
[im 6/28]
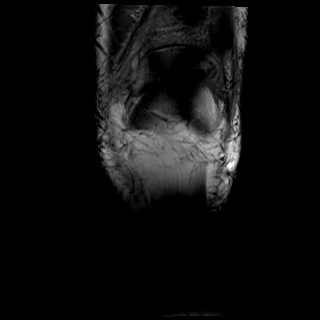
[im 11/28]
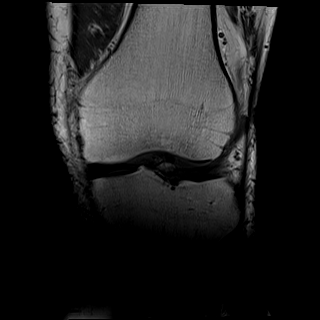
[im 17/28]
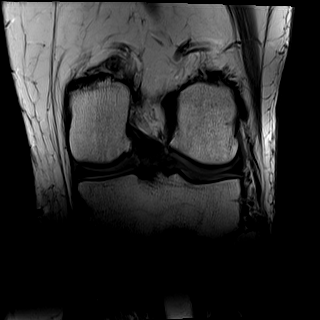
[im 22/28]
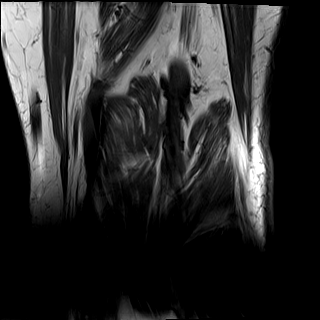
[im 28/28]
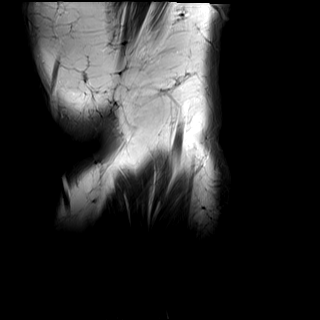

[Series 9: PD fat-sat · coronal · left · 3.0mm · 0.47mm/px · 6 of 32 slices shown (1 of 2)]
[im 1/32]
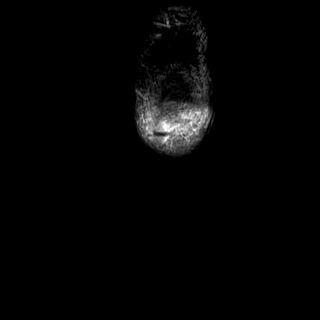
[im 7/32]
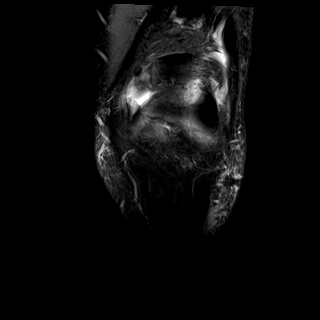
[im 13/32]
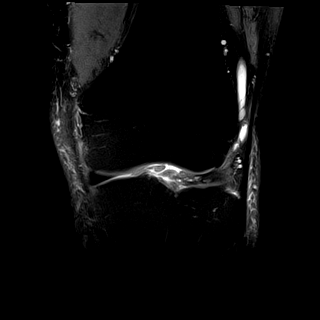
[im 19/32]
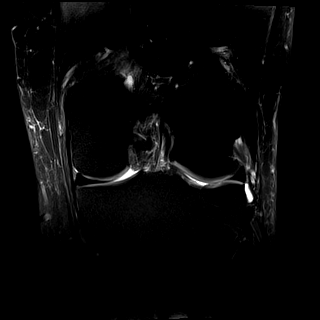
[im 25/32]
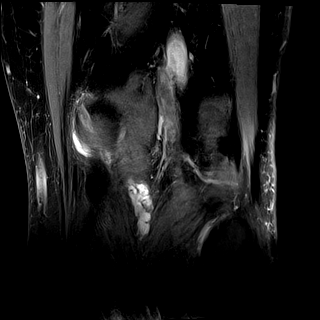
[im 32/32]
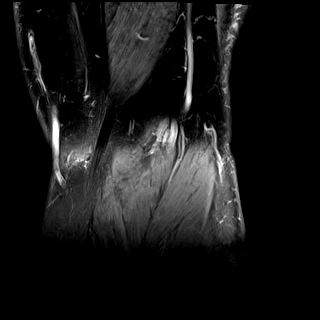

[Series 10: PD fat-sat · sagittal · left · 3.0mm · 0.47mm/px · 6 of 28 slices shown (2 of 2)]
[im 1/28]
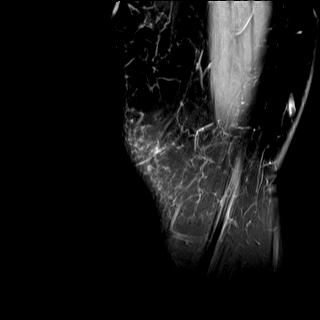
[im 6/28]
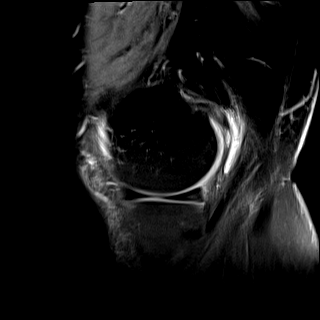
[im 11/28]
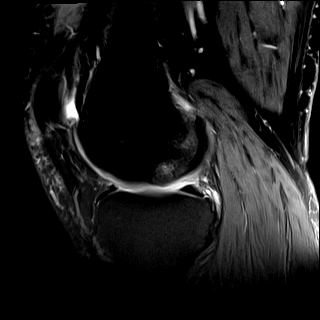
[im 17/28]
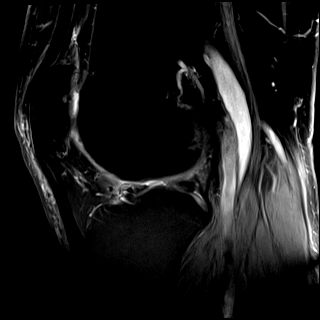
[im 22/28]
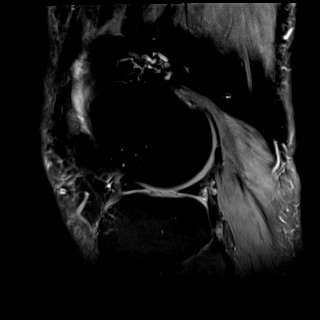
[im 28/28]
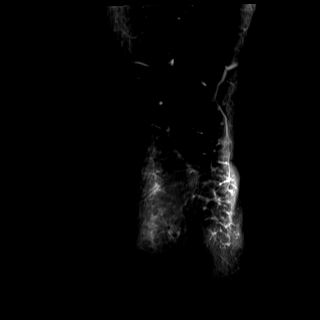

[Series 11: T2 fat-sat · sagittal · left · 3.0mm · 0.47mm/px · 6 of 28 slices shown (3 of 3)]
[im 1/28]
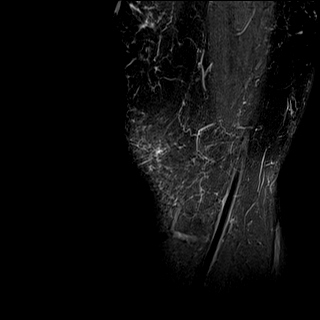
[im 6/28]
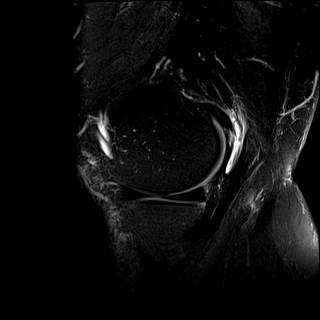
[im 11/28]
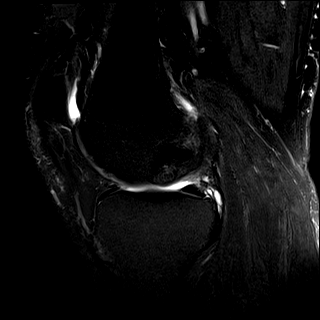
[im 17/28]
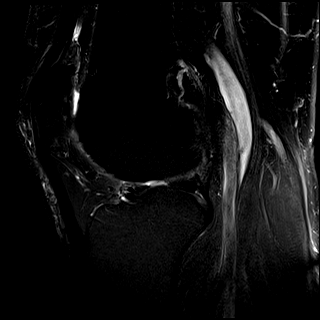
[im 22/28]
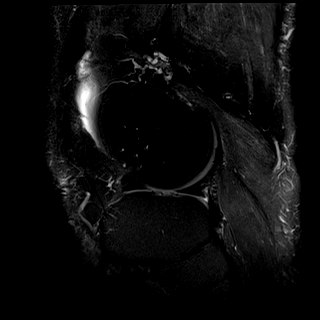
[im 28/28]
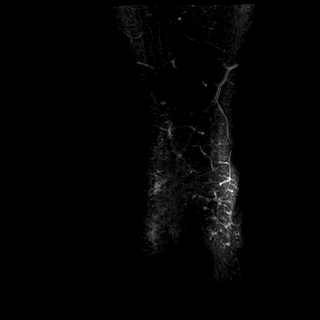

[Series 12: PD · oblique · left · 1.5mm · 0.44mm/px · 4 of 21 slices shown]
[im 1/21]
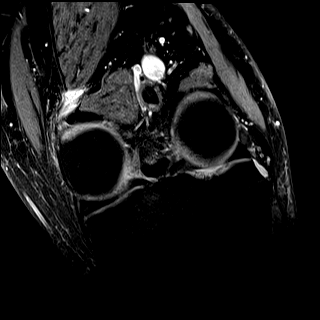
[im 7/21]
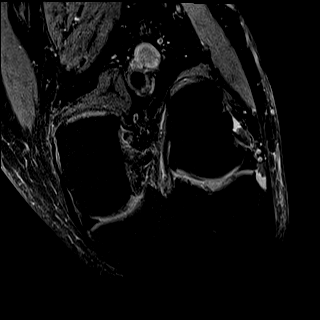
[im 14/21]
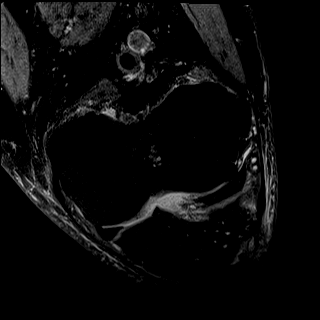
[im 21/21]
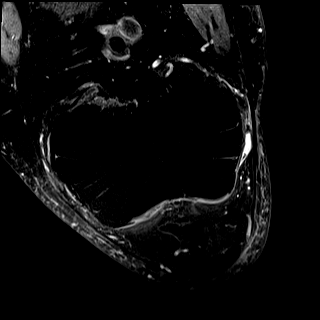

[40 of 40 positions shown; findings below may reference images not displayed]

FINDINGS: MENISCI

Medial meniscus:  Intact.

Lateral meniscus: Intact.

LIGAMENTS

Cruciates: Intact ACL and PCL.

Collaterals: Medial collateral ligament is intact. Lateral
collateral ligament complex is intact.

CARTILAGE

Patellofemoral: Mild chondral thinning and surface irregularity of
the patellofemoral compartment.

Medial: Minimal surface irregularity along the weight-bearing medial
femoral condyle without focal defect.

Lateral: Moderate focal chondral thinning with areas of
full-thickness fissuring involving the central aspect of the lateral
tibial plateau.

Joint: Trace knee joint effusion. Elongated multilobulated cystic
structure extends inferiorly along the posterior margin of the
medial tibial plateau measuring approximately 2.8 x 0.9 cm, likely
ganglion cyst. Fat pads within normal limits.

Popliteal Fossa:  Tiny Baker's cyst.  Intact popliteus tendon.

Extensor Mechanism: Intact quadriceps tendon and patellar tendon.
Mild proximal patellar tendinosis.

Bones: Mild reactive subchondral marrow signal changes within the
lateral patella. No fracture. No malalignment. No suspicious bone
lesion.

Other: None.
IMPRESSION: 1. Tricompartmental degenerative changes, as described.
2. Mild proximal patellar tendinosis.
3. Trace knee joint effusion with tiny Baker's cyst.
4. Intact menisci.  Intact cruciate and collateral ligaments.

## 2022-08-21 ENCOUNTER — Other Ambulatory Visit: Payer: Self-pay | Admitting: Urology

## 2022-08-29 NOTE — Progress Notes (Addendum)
COVID Vaccine received:  []  No [x]  Yes Date of any COVID positive Test in last 90 days:  None  PCP - Knox Royalty, MD  [facility]  Cardiologist - Wille Glaser, MD at [facility] cardiology Phone: [phone]  Fax: [phone]   Chest x-ray - 05-30-2021  2v   CE EKG - 10-17-2021  CE  requested from Cardiologist   Stress Test -  ECHO - 12-20-2021  CE Cardiac Cath -   PCR screen: []  Ordered & Completed           []   No Order but Needs PROFEND           [x]   N/A for this surgery  Surgery Plan:  [x]  Ambulatory                            []  Outpatient in bed                            []  Admit  Anesthesia:    [x]  General  []  Spinal                           []   Choice []   MAC  Bowel Prep - [x]  No  []   Yes ______  Pacemaker / ICD device [x]  No []  Yes   Spinal Cord Stimulator:[x]  No []  Yes       History of Sleep Apnea? []  No [x]  Yes   CPAP used?- []  No [x]  Yes  Pt will not bring mask or tubing  Does the patient monitor blood sugar?          []  No []  Yes  [x]  N/A Patient has: [x]  NO Hx DM   []  Pre-DM                 []  DM1  []   DM2  Blood Thinner / Instructions:  none Aspirin Instructions:  ASA 81 mg   He thinks it's 5 day hold but hasn't read any of the information that the Surgeon gave him.   ERAS Protocol Ordered: [x]  No  []  Yes Patient is to be NPO after:  midnight prior  Patient takes all medications at night from [facility]  Activity level: Patient is able climb a flight of stairs without difficulty; [x]  No CP   but would have _SOB. Patient can perform ADLs without assistance.   Anesthesia review: Bipolar, HTN, Bradycardia, Substance abuse (ETOH and Cocaine)  OSA-CPAP, S/p prostatectomy, GERD,   has Hand Tremors  Patient denies shortness of breath, fever, cough and chest pain at PAT appointment.  Patient verbalized understanding and agreement to the Pre-Surgical Instructions that were given to them at this PAT appointment. Patient was also educated of the need to  review these PAT instructions again prior to his surgery.I reviewed the appropriate phone numbers to call if they have any and questions or concerns.

## 2022-08-29 NOTE — Patient Instructions (Addendum)
SURGICAL WAITING ROOM VISITATION Patients having surgery or a procedure may have no more than 2 support people in the waiting area - these visitors may rotate in the visitor waiting room.   Due to an increase in RSV and influenza rates and associated hospitalizations, children ages 35 and under may not visit patients in [facility] hospitals. If the patient needs to stay at the hospital during part of their recovery, the visitor guidelines for inpatient rooms apply.  PRE-OP VISITATION  Pre-op nurse will coordinate an appropriate time for 1 support person to accompany the patient in pre-op.  This support person may not rotate.  This visitor will be contacted when the time is appropriate for the visitor to come back in the pre-op area.  Please refer to the [facility] website for the visitor guidelines for Inpatients (after your surgery is over and you are in a regular room).  You are not required to quarantine at this time prior to your surgery. However, you must do this: Hand Hygiene often Do NOT share personal items Notify your provider if you are in close contact with someone who has COVID or you develop fever 100.4 or greater, new onset of sneezing, cough, sore throat, shortness of breath or body aches.  If you test positive for Covid or have been in contact with anyone that has tested positive in the last 10 days please notify you surgeon.    Your procedure is scheduled on:  Thursday  Sep 12, 2022  Report to [facility] Main Entrance: [address] entrance where the [employer] is available.   Report to admitting at: 06:45  AM  +++++Call this number if you have any questions or problems the morning of surgery [phone]  DO NOT EAT OR DRINK ANYTHING AFTER MIDNIGHT THE NIGHT PRIOR TO YOUR SURGERY / PROCEDURE.   FOLLOW BOWEL PREP AND ANY ADDITIONAL PRE OP INSTRUCTIONS YOU RECEIVED FROM YOUR SURGEON'S OFFICE!!!   Oral Hygiene is also important to reduce your risk of infection.         Remember - BRUSH YOUR TEETH THE MORNING OF SURGERY WITH YOUR REGULAR TOOTHPASTE  Do NOT smoke after Midnight the night before surgery.  Take ONLY these medicines the morning of surgery with A SIP OF WATER:  none                    You may not have any metal on your body including  jewelry, and body piercing  Do not wear  lotions, powders,  cologne, or deodorant  Men may shave face and neck.  Contacts, Hearing Aids, dentures or bridgework may not be worn into surgery. DENTURES WILL BE REMOVED PRIOR TO SURGERY PLEASE DO NOT APPLY "Poly grip" OR ADHESIVES!!!  Patients discharged on the day of surgery will not be allowed to drive home.  Someone NEEDS to stay with you for the first 24 hours after anesthesia.  Do not bring your home medications to the hospital. The Pharmacy will dispense medications listed on your medication list to you during your admission in the Hospital.  Please read over the following fact sheets you were given: IF YOU HAVE QUESTIONS ABOUT YOUR PRE-OP INSTRUCTIONS, PLEASE CALL [phone].   [facility] - Preparing for Surgery Before surgery, you can play an important role.  Because skin is not sterile, your skin needs to be as free of germs as possible.  You can reduce the number of germs on your skin by washing with CHG (chlorahexidine  gluconate) soap before surgery.  CHG is an antiseptic cleaner which kills germs and bonds with the skin to continue killing germs even after washing. Please DO NOT use if you have an allergy to CHG or antibacterial soaps.  If your skin becomes reddened/irritated stop using the CHG and inform your nurse when you arrive at Short Stay. Do not shave (including legs and underarms) for at least 48 hours prior to the first CHG shower.  You may shave your face/neck.  Please follow these instructions carefully:  1.  Shower with CHG Soap the night before surgery and the  morning of surgery.  2.  If you choose to wash your hair, wash your  hair first as usual with your normal  shampoo.  3.  After you shampoo, rinse your hair and body thoroughly to remove the shampoo.                             4.  Use CHG as you would any other liquid soap.  You can apply chg directly to the skin and wash.  Gently with a scrungie or clean washcloth.  5.  Apply the CHG Soap to your body ONLY FROM THE NECK DOWN.   Do not use on face/ open                           Wound or open sores. Avoid contact with eyes, ears mouth and genitals (private parts).                       Wash face,  Genitals (private parts) with your normal soap.             6.  Wash thoroughly, paying special attention to the area where your  surgery  will be performed.  7.  Thoroughly rinse your body with warm water from the neck down.  8.  DO NOT shower/wash with your normal soap after using and rinsing off the CHG Soap.            9.  Pat yourself dry with a clean towel.            10.  Wear clean pajamas.            11.  Place clean sheets on your bed the night of your first shower and do not  sleep with pets.  ON THE DAY OF SURGERY : Do not apply any lotions/deodorants the morning of surgery.  Please wear clean clothes to the hospital/surgery center.    FAILURE TO FOLLOW THESE INSTRUCTIONS MAY RESULT IN THE CANCELLATION OF YOUR SURGERY  PATIENT SIGNATURE_________________________________  NURSE SIGNATURE__________________________________  ________________________________________________________________________

## 2022-08-30 ENCOUNTER — Encounter (HOSPITAL_COMMUNITY)
Admission: RE | Admit: 2022-08-30 | Discharge: 2022-08-30 | Disposition: A | Discharge status: Home / Self Care | Payer: 59 | Source: Ambulatory Visit | Attending: Urology | Admitting: Urology

## 2022-08-30 ENCOUNTER — Encounter (HOSPITAL_COMMUNITY): Payer: Self-pay

## 2022-08-30 ENCOUNTER — Other Ambulatory Visit: Payer: Self-pay

## 2022-08-30 VITALS — BP 113/76 | HR 58 | Temp 98.1°F | Resp 16 | Ht 67.0 in | Wt 226.0 lb

## 2022-08-30 DIAGNOSIS — Z01818 Encounter for other preprocedural examination: Secondary | ICD-10-CM | POA: Diagnosis present

## 2022-08-30 DIAGNOSIS — I10 Essential (primary) hypertension: Secondary | ICD-10-CM | POA: Diagnosis not present

## 2022-08-30 HISTORY — DX: Unspecified osteoarthritis, unspecified site: M19.90

## 2022-08-30 HISTORY — DX: Myoneural disorder, unspecified: G70.9

## 2022-08-30 LAB — COMPREHENSIVE METABOLIC PANEL
ALT: 30 U/L (ref 0–44)
AST: 24 U/L (ref 15–41)
Albumin: 4.1 g/dL (ref 3.5–5.0)
Alkaline Phosphatase: 79 U/L (ref 38–126)
Anion gap: 6 (ref 5–15)
BUN: 14 mg/dL (ref 8–23)
CO2: 25 mmol/L (ref 22–32)
Calcium: 9.2 mg/dL (ref 8.9–10.3)
Chloride: 111 mmol/L (ref 98–111)
Creatinine, Ser: 1 mg/dL (ref 0.61–1.24)
GFR, Estimated: >60 mL/min (ref >60)
Glucose, Bld: 108 mg/dL — ABNORMAL HIGH (ref 70–99)
Potassium: 4.2 mmol/L (ref 3.5–5.1)
Sodium: 142 mmol/L (ref 135–145)
Total Bilirubin: 0.7 mg/dL (ref 0.3–1.2)
Total Protein: 6.1 g/dL — ABNORMAL LOW (ref 6.5–8.1)

## 2022-08-30 LAB — CBC
HCT: 44.5 % (ref 39.0–52.0)
Hemoglobin: 14.4 g/dL (ref 13.0–17.0)
MCH: 30.9 pg (ref 26.0–34.0)
MCHC: 32.4 g/dL (ref 30.0–36.0)
MCV: 95.5 fL (ref 80.0–100.0)
Platelets: 181 10*3/uL (ref 150–400)
RBC: 4.66 MIL/uL (ref 4.22–5.81)
RDW: 13.6 % (ref 11.5–15.5)
WBC: 9.5 10*3/uL (ref 4.0–10.5)
nRBC: 0 % (ref 0.0–0.2)

## 2022-09-11 MED ORDER — SODIUM CHLORIDE 0.9 % IV SOLN
2.0000 g | INTRAVENOUS | Status: DC
Start: 2022-09-11 — End: 2022-09-11

## 2022-09-11 NOTE — H&P (Signed)
Office Visit Report     08/21/2022   --------------------------------------------------------------------------------   Colin Kelley  MRN: [id number]  DOB: December 20, 1960, 62 year old Male  SSN: [id number]   PRIMARY CARE:  [facility]  PRIMARY CARE FAX:  [phone]  REFERRING:  Colin Han, PA  PROVIDER:  Jerilee Kelley, M.D.  TREATING:  Colin Han, PA-C  LOCATION:  [facility] [phone]     --------------------------------------------------------------------------------   CC/HPI: Follow-up-   1) incomplete bladder emptying-patient had radical prostatectomy in 2007. By 2018 he was found to have a large capacity hyposensitive bladder on urodynamics. He voided with straining and a low flow. Cystoscopy in 2018 noted no stricture and no bladder neck contracture. He has been on CIC.   He underwent a CT scan of the abdomen and pelvis in 2020 which was benign. No stone or hydro-. He had variable use of CIC in 2022 and had a staph UTI. Follow-up earlier in 2023 showed a post void of 314 and bladder scan Mar 2023 at 346 ml. He has been trying to catheterize more frequently. He's had some dysuria. He has not been doing CIC consistently in 2023. He strains to void but has frequency and dysuria. UA looks like UTI. Bladder scan 428 ml. Also wetting the bed - told to increase CIC.    2) low testosterone-history of orchiectomy at age 7. On testosterone replacement since 2007. He has used Testopel in the past. His T remained 81 with bioT 39 in Mar 2023.  -Jul 2023 Testopel x 6    3) erectile dysfunction-he failed PDE 5 inhibitor trials. He tried Trimix in 2019. Too painful but worked. He tried Cialis and it worked adequately.    4) history of prostate cancer-RRP in 2007. His PSA was undetectable in 2020 and 2023.   5) gross hematuria - noted July 2023 with possible UTI.    Today, seen for the above to eval gross hematuria and his bladder function. Also  h/o PCa and low T . Renal US today with possibly mild right hydro, bladder 638 ml, thickened wall. Cysto with scattered erythema but thick nodular left anterior bladder wall.   07/06/2022:  Patient returns for overdue follow-up. He was last seen on 01/31/2022, and was pending scheduling for cystoscopy with biopsy for bladder wall nodularity with gross hematuria. The office was subsequently unable to reach patient by phone over multiple attempts, and two surgical posting sheets were canceled.   Today, patient states that he has the urge to urinate every 10 minutes. He continues to only perform CIC once daily at most. He states that he has sufficient catheters at home, but offers no specific reason for not performing CIC 3-4 times daily, as recommended. Today, he is experiencing burning on urination, and has concerns for a UTI. He has also has had a low-grade fever for 2 weeks per his report, though he has not measured his temperature. He denies gross hematuria, suprapubic discomfort, flank pain, nausea/vomiting. He was under the impression that today's appointment was for cystoscopy.   08/21/2022:  Patient presented for overdue follow-up on UTI, cystoscopy. This office was unable to reach patient in the interim in order to schedule cystoscopy. Today, patient presents with request to proceed. He has picked up his Cipro in the interim, and endorses only temporary resolution of his UTI symptoms. He continues to use 3 catheters per day. He denies gross hematuria, flank pain, nausea/vomiting, fever/chills.     ALLERGIES: Corn  Syrup Shellfish Sulfa    MEDICATIONS: Lisinopril  Prilosec  Fish Oil  Gabapentin  Lamictal  Lithium  Multi-Vitamin  Penile Injections  Seroquel  Stool Softener  Tadalafil 20 mg tablet 1 tablet PO Daily PRN  Trazodone Hcl  Tylenol  Vitamin B-6     GU PSH: Catheterize For Residual - 2018 Complex cystometrogram, w/ void pressure and urethral pressure profile studies, any  technique - 2018 Complex Uroflow - 2018 Cystoscopy - 01/31/2022, 2020, 2018 Emg surf Electrd - 2018 Exc H-f-nk-sp Mlg+mar > 4 Cm - 2018 Implant Hormone Pellet(s) - 11/19/2021, 2019, 2019, 2019 Inject For cystogram - 2018 Intrabd voidng Press - 2018 Locm 300-399Mg /Ml Iodine,1Ml - 2020 Partial orchiectomy Penile Injection - 2019 Remove Prostate.       PSH Notes: left ankle surgery- screws for fixation.    NON-GU PSH: Ankle Arthroscopy/surgery, Left Shoulder Surgery (Unspecified) Sinus Surgery.. Testosterone Pellet, 75mg  - 11/19/2021, 2019, 2019, 2019, 2018     GU PMH: Dysuria - 07/08/2022 History of prostate cancer - 07/08/2022, psa sent , - 01/31/2022, PSA sent , - 07/12/2021, - 2020 Other Disorders Of Bladder - 07/08/2022, we discussed the nature r/b/a to cysto/bbx/TURBT and BRGPs and he will proceed. , - 01/31/2022 Gross hematuria, eval as above - 01/31/2022, (Stable), f/u for renal US and cystoscopy - we can recheck T that day , - 11/19/2021, - 2020, - 2020 Hydronephrosis - 01/31/2022 Primary hypogonadism, T sent - 01/31/2022, s/p testopel , - 11/19/2021, T and hct sent. Schedule TESTOPEL if T low. Will send in topical to make it to the appt. 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   Initial PVR 540 ml. He had to strain to void and recheck PVR was 32 ml. No UTI or gross hematuria. He drinks 5 caffeine drinks per day.  Mild incontinence. No pad use. NG risk includes DDD. No back surgery but considered fusion years ago.  Cysto was normal Feb 2018. No sx or BNC. Large capacity bladder. Urodynamics revealed a postvoid of 350 mL, he was drained and then showed a capacity of 708 mL, hyposensitive bladder, mild stress incontinence; he voided with normal pressure but low flow, straining and EMG flare. It appeared to be dysfunctional.  He continues to strain to void. He leaks when he has a full bladder. No pad use.     NON-GU PMH: Muscle weakness (generalized) - 2020, - 2019, - 2019, - 2018, - 2018, - 2018, - 2018, - 2018, - 2018 Other muscle spasm - 2020, - 2019, - 2019, - 2018, - 2018, - 2018, - 2018, - 2018, - 2018 Fecal urgency - 2018, - 2018 Anxiety Arthritis Depression GERD Hypercholesterolemia Hypertension Sleep Apnea    Immunizations: None  FAMILY HISTORY: Kidney Failure - Mother   SOCIAL HISTORY: Marital Status: Single Preferred Language: English; Ethnicity: Not Hispanic Or Latino; Race: White Current Smoking Status: Patient does not smoke anymore.   Tobacco Use Assessment Completed: Used Tobacco in last 30 days? Uses smokeless tobacco. Drinks 4+ caffeinated drinks per day.    REVIEW OF SYSTEMS:    GU Review Male:   Patient reports frequent urination, burning/ pain with urination, get up at night to urinate, and have to strain to urinate . Patient denies hard to postpone urination, leakage of urine, stream starts and stops, trouble starting your stream, erection problems, and penile pain.  Gastrointestinal (Upper):   Patient denies nausea, vomiting, and indigestion/ heartburn.  Gastrointestinal (Lower):   Patient denies diarrhea and constipation.  Constitutional:   Patient denies fever, night sweats, weight loss, and fatigue.  Skin:   Patient  denies skin rash/ lesion and itching.  Eyes:   Patient denies blurred vision and double vision.  Ears/ Nose/ Throat:   Patient denies sore throat and sinus problems.  Hematologic/Lymphatic:   Patient denies swollen glands and easy bruising.  Cardiovascular:   Patient denies chest pains and leg swelling.  Respiratory:   Patient denies cough and shortness of breath.  Endocrine:   Patient denies excessive thirst.  Musculoskeletal:   Patient denies back pain and joint pain.  Neurological:   Patient denies headaches and dizziness.  Psychologic:   Patient denies depression and anxiety.   VITAL SIGNS:      08/21/2022 03:54 PM  BP 119/72 mmHg  Pulse 69 /min   MULTI-SYSTEM PHYSICAL EXAMINATION:    Constitutional: Morbidly obese. No physical deformities. Normally developed. Good grooming. Patient appears sleepy in office today. He closes his eyes intermittently.  Respiratory: No labored breathing, no use of accessory muscles. Lungs clear to auscultation bilaterally. No wheezing, Rales.  Cardiovascular: Normal temperature, normal extremity pulses, no swelling. Regular rate and rhythm, no murmurs, gallops.  Skin: No paleness, no jaundice, no cyanosis.   Neurologic / Psychiatric: Oriented to time, oriented to place, oriented to person. No depression, no anxiety, no agitation.  Gastrointestinal: Morbidly obese abdomen. No mass, no suprapubic or bilateral CVA tenderness, no rigidity.      Complexity of Data:  Source Of History:  Patient, Medical Record Summary  Records Review:   Previous Doctor Records, Previous Patient Records  Urine Test Review:   Urinalysis   01/31/22 07/12/21 06/10/18 10/09/17 05/20/17 12/20/16 06/03/16  PSA  Total PSA <0.015 ng/mL <0.015 ng/mL <0.015 ng/mL <0.015 ng/mL <0.015 ng/mL <0.015 ng/dL < 1.610 ng/dl    96/04/54 09/81/19 14/78/29 10/09/17 05/20/17 12/20/16 06/03/16  Hormones  Testosterone, Total 299.9 ng/dL 562.1 ng/dL 308.6 ng/dL 578.4 ng/dL 696.2 ng/dL 952.8 ng/dL  413.2 pg/dL    44/01/02  Urinalysis  Urine Appearance Cloudy   Urine Color Yellow   Urine Glucose Neg mg/dL  Urine Bilirubin Neg mg/dL  Urine Ketones Neg mg/dL  Urine Specific Gravity 1.025   Urine Blood 3+ ery/uL  Urine pH 6.0   Urine Protein 3+ mg/dL  Urine Urobilinogen 0.2 mg/dL  Urine Nitrites Positive   Urine Leukocyte Esterase 3+ leu/uL  Urine WBC/hpf >60/hpf   Urine RBC/hpf Packed/hpf   Urine Epithelial Cells NS (Not Seen)   Urine Bacteria Mod (26-50/hpf)   Urine Mucous Not Present   Urine Yeast NS (Not Seen)   Urine Trichomonas Not Present   Urine Cystals Amorph Urates   Urine Casts NS (Not Seen)   Urine Sperm Not Present  PROCEDURES:         PVR Ultrasound - 40981  Scanned Volume: 242 cc         Urinalysis w/Scope Dipstick Dipstick Cont'd Micro  Color: Yellow Bilirubin: Neg mg/dL WBC/hpf: >19/JYN  Appearance: Cloudy Ketones: Neg mg/dL RBC/hpf: Packed/hpf  Specific Gravity: 1.025 Blood: 3+ ery/uL Bacteria: Mod (26-50/hpf)  pH: 6.0 Protein: 3+ mg/dL Cystals: Amorph Urates  Glucose: Neg mg/dL Urobilinogen: 0.2 mg/dL Casts: NS (Not Seen)    Nitrites: Positive Trichomonas: Not Present    Leukocyte Esterase: 3+ leu/uL Mucous: Not Present      Epithelial Cells: NS (Not Seen)      Yeast: NS (Not Seen)      Sperm: Not Present    ASSESSMENT:      ICD-10 Details  1 GU:   Acute Cystitis/UTI - N30.00 Acute, Uncomplicated  2   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 We will send for culture and follow-up with patient as appropriate.  We will initiate him on empiric doxycycline.   We confirmed patient's intent to proceed with cystoscopy, and reactivated the surgical posting sheet. His urologist's scheduler proceeded with scheduling patient while in office. We further confirmed patient's phone number.   We will have patient proceed with schedule cystoscopy. Patient knows to return to clinic with development of further concerns, or worsening presentation. Patient voiced understanding and is amenable to this plan.        Next Appointment:      Next Appointment: 09/12/2022 09:00 AM    Appointment Type: Surgery     Location: [facility] [phone]    Provider: Jerilee Kelley, M.D.    Reason for Visit: OP WL CYSTO BLD BX/ TUR BT BIL RGP    * Signed by Colin Han, PA-C on 08/22/22 at 9:37 PM (EDT)*     Add: his urine cx grew enterobacter and he took 10 day course of doxycycline. I also switched his pre-op abx to Levaquin.

## 2022-09-12 ENCOUNTER — Ambulatory Visit (HOSPITAL_BASED_OUTPATIENT_CLINIC_OR_DEPARTMENT_OTHER): Payer: 59 | Admitting: Certified Registered"

## 2022-09-12 ENCOUNTER — Ambulatory Visit (HOSPITAL_COMMUNITY): Payer: 59 | Admitting: Certified Registered"

## 2022-09-12 ENCOUNTER — Encounter (HOSPITAL_COMMUNITY)
Admission: RE | Disposition: A | Discharge status: Home / Self Care | Payer: Self-pay | Source: Ambulatory Visit | Attending: Urology

## 2022-09-12 ENCOUNTER — Encounter (HOSPITAL_COMMUNITY): Payer: Self-pay | Admitting: Urology

## 2022-09-12 ENCOUNTER — Ambulatory Visit (HOSPITAL_COMMUNITY)
Admission: RE | Admit: 2022-09-12 | Discharge: 2022-09-12 | Disposition: A | Discharge status: Home / Self Care | Payer: 59 | Source: Ambulatory Visit | Attending: Urology | Admitting: Urology

## 2022-09-12 ENCOUNTER — Ambulatory Visit (HOSPITAL_COMMUNITY): Payer: 59

## 2022-09-12 DIAGNOSIS — I10 Essential (primary) hypertension: Secondary | ICD-10-CM | POA: Diagnosis not present

## 2022-09-12 DIAGNOSIS — G4730 Sleep apnea, unspecified: Secondary | ICD-10-CM | POA: Insufficient documentation

## 2022-09-12 DIAGNOSIS — F17210 Nicotine dependence, cigarettes, uncomplicated: Secondary | ICD-10-CM | POA: Diagnosis not present

## 2022-09-12 DIAGNOSIS — Z79899 Other long term (current) drug therapy: Secondary | ICD-10-CM | POA: Diagnosis not present

## 2022-09-12 DIAGNOSIS — Z9989 Dependence on other enabling machines and devices: Secondary | ICD-10-CM

## 2022-09-12 DIAGNOSIS — N312 Flaccid neuropathic bladder, not elsewhere classified: Secondary | ICD-10-CM | POA: Insufficient documentation

## 2022-09-12 DIAGNOSIS — Z87891 Personal history of nicotine dependence: Secondary | ICD-10-CM | POA: Diagnosis not present

## 2022-09-12 DIAGNOSIS — C679 Malignant neoplasm of bladder, unspecified: Secondary | ICD-10-CM | POA: Diagnosis not present

## 2022-09-12 DIAGNOSIS — Z9079 Acquired absence of other genital organ(s): Secondary | ICD-10-CM | POA: Insufficient documentation

## 2022-09-12 DIAGNOSIS — N3081 Other cystitis with hematuria: Secondary | ICD-10-CM | POA: Diagnosis not present

## 2022-09-12 DIAGNOSIS — Z8546 Personal history of malignant neoplasm of prostate: Secondary | ICD-10-CM | POA: Diagnosis not present

## 2022-09-12 DIAGNOSIS — R310 Gross hematuria: Secondary | ICD-10-CM | POA: Diagnosis present

## 2022-09-12 DIAGNOSIS — N529 Male erectile dysfunction, unspecified: Secondary | ICD-10-CM | POA: Insufficient documentation

## 2022-09-12 HISTORY — PX: TRANSURETHRAL RESECTION OF BLADDER TUMOR: SHX2575

## 2022-09-12 HISTORY — PX: CYSTOSCOPY WITH BIOPSY: SHX5122

## 2022-09-12 SURGERY — CYSTOSCOPY, WITH BIOPSY
Anesthesia: General | Site: Bladder

## 2022-09-12 MED ORDER — ONDANSETRON HCL 4 MG/2ML IJ SOLN
INTRAMUSCULAR | Status: AC
  Filled 2022-09-12: qty 2

## 2022-09-12 MED ORDER — EPHEDRINE 5 MG/ML INJ
INTRAVENOUS | Status: AC
  Filled 2022-09-12: qty 5

## 2022-09-12 MED ORDER — LIDOCAINE 2% (20 MG/ML) 5 ML SYRINGE
INTRAMUSCULAR | Status: DC | PRN
  Administered 2022-09-12: 40 mg via INTRAVENOUS

## 2022-09-12 MED ORDER — ROCURONIUM BROMIDE 10 MG/ML (PF) SYRINGE
PREFILLED_SYRINGE | INTRAVENOUS | Status: DC | PRN
  Administered 2022-09-12: 10 mg via INTRAVENOUS
  Administered 2022-09-12: 50 mg via INTRAVENOUS

## 2022-09-12 MED ORDER — ROCURONIUM BROMIDE 10 MG/ML (PF) SYRINGE
PREFILLED_SYRINGE | INTRAVENOUS | Status: AC
  Filled 2022-09-12: qty 10

## 2022-09-12 MED ORDER — MIDAZOLAM HCL 2 MG/2ML IJ SOLN
INTRAMUSCULAR | Status: DC | PRN
  Administered 2022-09-12: 2 mg via INTRAVENOUS

## 2022-09-12 MED ORDER — IOHEXOL 300 MG/ML  SOLN
INTRAMUSCULAR | Status: DC | PRN
  Administered 2022-09-12: 44 mL

## 2022-09-12 MED ORDER — LACTATED RINGERS IV SOLN: INTRAVENOUS | Status: DC

## 2022-09-12 MED ORDER — FENTANYL CITRATE (PF) 100 MCG/2ML IJ SOLN
INTRAMUSCULAR | Status: DC | PRN
  Administered 2022-09-12: 50 ug via INTRAVENOUS

## 2022-09-12 MED ORDER — SUGAMMADEX SODIUM 200 MG/2ML IV SOLN
INTRAVENOUS | Status: DC | PRN
  Administered 2022-09-12: 200 mg via INTRAVENOUS

## 2022-09-12 MED ORDER — CHLORHEXIDINE GLUCONATE 0.12 % MT SOLN
15.0000 mL | Freq: Once | OROMUCOSAL | Status: AC
  Administered 2022-09-12: 15 mL via OROMUCOSAL

## 2022-09-12 MED ORDER — STERILE WATER FOR IRRIGATION IR SOLN
Status: DC | PRN
  Administered 2022-09-12: 3000 mL

## 2022-09-12 MED ORDER — PROPOFOL 10 MG/ML IV BOLUS
INTRAVENOUS | Status: DC | PRN
  Administered 2022-09-12: 140 mg via INTRAVENOUS

## 2022-09-12 MED ORDER — FENTANYL CITRATE (PF) 100 MCG/2ML IJ SOLN
INTRAMUSCULAR | Status: AC
  Filled 2022-09-12: qty 2

## 2022-09-12 MED ORDER — ORAL CARE MOUTH RINSE: 15.0000 mL | Freq: Once | OROMUCOSAL | Status: AC

## 2022-09-12 MED ORDER — ONDANSETRON HCL 4 MG/2ML IJ SOLN
INTRAMUSCULAR | Status: DC | PRN
  Administered 2022-09-12: 4 mg via INTRAVENOUS

## 2022-09-12 MED ORDER — EPHEDRINE SULFATE-NACL 50-0.9 MG/10ML-% IV SOSY
PREFILLED_SYRINGE | INTRAVENOUS | Status: DC | PRN
  Administered 2022-09-12 (×6): 5 mg via INTRAVENOUS

## 2022-09-12 MED ORDER — STERILE WATER FOR IRRIGATION IR SOLN
Status: DC | PRN
  Administered 2022-09-12: 250 mL

## 2022-09-12 MED ORDER — DEXAMETHASONE SODIUM PHOSPHATE 10 MG/ML IJ SOLN
INTRAMUSCULAR | Status: DC | PRN
  Administered 2022-09-12: 4 mg via INTRAVENOUS

## 2022-09-12 MED ORDER — FENTANYL CITRATE PF 50 MCG/ML IJ SOSY: 25.0000 ug | PREFILLED_SYRINGE | INTRAMUSCULAR | Status: DC | PRN

## 2022-09-12 MED ORDER — PROPOFOL 10 MG/ML IV BOLUS
INTRAVENOUS | Status: AC
  Filled 2022-09-12: qty 20

## 2022-09-12 MED ORDER — DEXAMETHASONE SODIUM PHOSPHATE 10 MG/ML IJ SOLN
INTRAMUSCULAR | Status: AC
  Filled 2022-09-12: qty 1

## 2022-09-12 MED ORDER — LEVOFLOXACIN IN D5W 750 MG/150ML IV SOLN
750.0000 mg | Freq: Once | INTRAVENOUS | Status: AC
  Administered 2022-09-12: 750 mg via INTRAVENOUS
  Filled 2022-09-12: qty 150

## 2022-09-12 MED ORDER — MIDAZOLAM HCL 2 MG/2ML IJ SOLN
INTRAMUSCULAR | Status: AC
  Filled 2022-09-12: qty 2

## 2022-09-12 MED ORDER — ACETAMINOPHEN 500 MG PO TABS
1000.0000 mg | ORAL_TABLET | Freq: Once | ORAL | Status: AC
  Administered 2022-09-12: 1000 mg via ORAL
  Filled 2022-09-12: qty 2

## 2022-09-12 SURGICAL SUPPLY — 17 items
BAG DRN RND TRDRP ANRFLXCHMBR (UROLOGICAL SUPPLIES)
BAG URINE DRAIN 2000ML AR STRL (UROLOGICAL SUPPLIES) IMPLANT
BAG URO CATCHER STRL LF (MISCELLANEOUS) ×3 IMPLANT
CATH FOLEY 2WAY SLVR  5CC 16FR (CATHETERS) ×2
CATH FOLEY 2WAY SLVR 5CC 16FR (CATHETERS) IMPLANT
DRAPE FOOT SWITCH (DRAPES) ×3 IMPLANT
GLOVE SURG LX STRL 7.5 STRW (GLOVE) ×3 IMPLANT
GOWN STRL REUS W/ TWL XL LVL3 (GOWN DISPOSABLE) ×3 IMPLANT
GOWN STRL REUS W/TWL XL LVL3 (GOWN DISPOSABLE) ×2
GUIDEWIRE STR DUAL SENSOR (WIRE) IMPLANT
KIT TURNOVER KIT A (KITS) IMPLANT
LOOP CUT BIPOLAR 24F LRG (ELECTROSURGICAL) IMPLANT
MANIFOLD NEPTUNE II (INSTRUMENTS) ×3 IMPLANT
PACK CYSTO (CUSTOM PROCEDURE TRAY) ×3 IMPLANT
SYR 10ML LL (SYRINGE) IMPLANT
TUBING CONNECTING 10 (TUBING) ×3 IMPLANT
TUBING UROLOGY SET (TUBING) ×3 IMPLANT

## 2022-09-12 NOTE — Anesthesia Postprocedure Evaluation (Signed)
Anesthesia Post Note  Patient: [name]  Procedure(s) Performed: CYSTOSCOPY WITH BIOPSY BILATERAL RETROGRADE PYELOGRAMS (Bilateral) TRANSURETHRAL RESECTION OF BLADDER TUMOR (TURBT) (Bladder)     Patient location during evaluation: PACU Anesthesia Type: General Level of consciousness: awake and alert Pain management: pain level controlled Vital Signs Assessment: post-procedure vital signs reviewed and stable Respiratory status: spontaneous breathing, nonlabored ventilation and respiratory function stable Cardiovascular status: blood pressure returned to baseline and stable Postop Assessment: no apparent nausea or vomiting Anesthetic complications: no  No notable events documented.  Last Vitals:  Vitals:   09/12/22 1111 09/12/22 1117  BP:  119/87  Pulse: (!) 48 (!) 52  Resp: 17   Temp:  37 C  SpO2: 92% 94%    Last Pain:  Vitals:   09/12/22 1111  TempSrc:   PainSc: Asleep                 Jaris Kohles,W. EDMOND

## 2022-09-12 NOTE — Anesthesia Procedure Notes (Signed)
Procedure Name: Intubation Date/Time: 09/12/2022 9:41 AM  Performed by: Sindy Guadeloupe, CRNAPre-anesthesia Checklist: Patient identified, Emergency Drugs available, Suction available, Patient being monitored and Timeout performed Patient Re-evaluated:Patient Re-evaluated prior to induction Oxygen Delivery Method: Circle system utilized Preoxygenation: Pre-oxygenation with 100% oxygen Induction Type: IV induction Ventilation: Mask ventilation without difficulty Laryngoscope Size: Mac and 4 Grade View: Grade I Tube type: Oral Tube size: 7.5 mm Number of attempts: 1 Airway Equipment and Method: Stylet Placement Confirmation: ETT inserted through vocal cords under direct vision, positive ETCO2 and breath sounds checked- equal and bilateral Secured at: 22 cm Tube secured with: Tape Dental Injury: Teeth and Oropharynx as per pre-operative assessment

## 2022-09-12 NOTE — Interval H&P Note (Signed)
History and Physical Interval Note:  09/12/2022 9:35 AM  Colin Kelley  has presented today for surgery, with the diagnosis of BLADDER ERYTHEMA.  The various methods of treatment have been discussed with the patient and family. After consideration of risks, benefits and other options for treatment, the patient has consented to  Procedure(s): CYSTOSCOPY WITH BIOPSY BILATERAL RETROGRADE PYELOGRAMS (Bilateral) POSSIBLE TRANSURETHRAL RESECTION OF BLADDER TUMOR (TURBT) (N/A) as a surgical intervention.  The patient's history has been reviewed, patient examined, no change in status, stable for surgery.  I have reviewed the patient's chart and labs.  We discussed that the findings of bladder could be benign/inflammation or malignant.  We discussed if it is malignant it is important to determine the stage and grade.  Sometimes we have to come back for another biopsy to determine determine the stage.  We discussed follow-up might include surveillance or sometimes immune boosting agents and not as often something more significant like chemotherapy or radiation and need for cystectomy.  All questions answered.  He has been well without fever or dysuria.  He strains to void but does void in between CIC and cast about twice per day.  He has had some intermittent gross hematuria.  We also discussed likely a postoperative Foley and then we will teach him how to remove that in a few days.  Questions were answered to the patient's satisfaction.  He elects to proceed.   Jerilee Field

## 2022-09-12 NOTE — Discharge Instructions (Signed)
Removal of the Foley: Remove the Foley on Tuesday morning Sep 17, 2022 as instructed.  Resume bladder catheterizations as you previously have done.

## 2022-09-12 NOTE — Progress Notes (Signed)
Called [facility] for orders on patient.  Will continue to monitor.  VSS

## 2022-09-12 NOTE — Anesthesia Preprocedure Evaluation (Addendum)
Anesthesia Evaluation  Patient identified by MRN, date of birth, ID band Patient awake    Reviewed: Allergy & Precautions, H&P , NPO status , Patient's Chart, lab work & pertinent test results  Airway Mallampati: III  TM Distance: >3 FB Neck ROM: Full    Dental no notable dental hx. (+) Partial Lower, Partial Upper, Dental Advisory Given   Pulmonary sleep apnea and Continuous Positive Airway Pressure Ventilation , Current Smoker and Patient abstained from smoking.   Pulmonary exam normal breath sounds clear to auscultation       Cardiovascular hypertension, Pt. on medications  Rhythm:Regular Rate:Normal     Neuro/Psych     Bipolar Disorder   negative neurological ROS     GI/Hepatic negative GI ROS, Neg liver ROS,,,  Endo/Other  negative endocrine ROS    Renal/GU negative Renal ROS  negative genitourinary   Musculoskeletal  (+) Arthritis , Osteoarthritis,    Abdominal   Peds  Hematology negative hematology ROS (+)   Anesthesia Other Findings   Reproductive/Obstetrics negative OB ROS                             Anesthesia Physical Anesthesia Plan  ASA: 3  Anesthesia Plan: General   Post-op Pain Management: Tylenol PO (pre-op)*   Induction: Intravenous  PONV Risk Score and Plan: 2 and Ondansetron, Dexamethasone and Midazolam  Airway Management Planned: Oral ETT  Additional Equipment:   Intra-op Plan:   Post-operative Plan: Extubation in OR  Informed Consent: I have reviewed the patients History and Physical, chart, labs and discussed the procedure including the risks, benefits and alternatives for the proposed anesthesia with the patient or authorized representative who has indicated his/her understanding and acceptance.     Dental advisory given  Plan Discussed with: CRNA  Anesthesia Plan Comments:        Anesthesia Quick Evaluation

## 2022-09-12 NOTE — Transfer of Care (Signed)
Immediate Anesthesia Transfer of Care Note  Patient: [name]  Procedure(s) Performed: CYSTOSCOPY WITH BIOPSY BILATERAL RETROGRADE PYELOGRAMS (Bilateral) TRANSURETHRAL RESECTION OF BLADDER TUMOR (TURBT) (Bladder)  Patient Location: PACU  Anesthesia Type:General  Level of Consciousness: awake, alert , and oriented  Airway & Oxygen Therapy: Patient Spontanous Breathing and Patient connected to face mask oxygen  Post-op Assessment: Report given to RN and Post -op Vital signs reviewed and stable  Post vital signs: Reviewed and stable  Last Vitals:  Vitals Value Taken Time  BP 137/87 09/12/22 1042  Temp 36.4 C 09/12/22 1042  Pulse 51 09/12/22 1043  Resp 15 09/12/22 1043  SpO2 99 % 09/12/22 1043  Vitals shown include unvalidated device data.  Last Pain:  Vitals:   09/12/22 0726  TempSrc:   PainSc: 0-No pain         Complications: No notable events documented.

## 2022-09-12 NOTE — Op Note (Signed)
Preoperative diagnosis: Gross hematuria, bladder neoplasm/erythema  Postoperative diagnosis: Same  Procedure: Cystoscopy with bladder biopsy and fulguration 0.5 to 2 cm, bilateral retrograde pyelogram  Surgeon: Mena Goes  Anesthesia: General  Indication for procedure: Efrin is a 62 year old male with a history of prostatectomy and hypotonic bladder.  He does CIC.  He had gross hematuria in office cystoscopy revealed significant cobblestoning of the mucosa and erythema as well as a possible nodular area.  He was brought today for above procedure and further evaluation of the urinary tract.  Findings: On exam under anesthesia the penis was circumcised without mass or lesion.  The glans and meatus appeared normal.  Testicles descended bilaterally and palpably normal.  Scrotum normal.  On cystoscopy the urethra was unremarkable, prostate surgically absent, trigone and ureteral orifice ease in their normal orthotopic position with clear E flux.  Bladder was significant cobblestone appearing mucosa with edema and erythema.  No specific papillary lesions.  No nodules or thickened areas once the bladder was distended.  Bladder uniformly affected.  I suspect what I was saying in the office was a undistended bladder wall.  Left retrograde pyelogram-this outlined a single ureter single collecting system unit without filling defect, stricture or dilation.  Right retrograde pyelogram-this outlined a single ureter single collecting system unit without filling defect, stricture or dilation.  Description of procedure: After consent was obtained patient brought to the operating room.  After adequate anesthesia he was placed lithotomy position and prepped and draped in the usual sterile fashion.  Timeout was performed to confirm the patient and procedure.  Cystoscope was passed per urethra the bladder carefully inspected.  I then used the cold cup biopsy forceps to biopsy the right bladder wall twice, posterior  bladder wall x 3, left bladder wall x 2.  These were very well represented samples of the entire bladder picture.  The biopsy sites were fulgurated.  Largest was about 15 mm.  Hemostasis was excellent low-pressure.  The scope was removed and a 16 [place] Foley catheter placed he is awakened taken recovery room in stable condition.  Complications: None  Blood loss: Minimal  Specimens to pathology: #1 right bladder wall biopsy #2 posterior bladder wall biopsy #3 left bladder wall biopsy  Drains: 16 French Foley catheter  Disposition: Patient stable to PACU

## 2022-09-13 ENCOUNTER — Encounter (HOSPITAL_COMMUNITY): Payer: Self-pay | Admitting: Urology

## 2022-09-13 LAB — SURGICAL PATHOLOGY

## 2023-08-23 ENCOUNTER — Emergency Department (HOSPITAL_COMMUNITY)

## 2023-08-23 ENCOUNTER — Emergency Department (HOSPITAL_COMMUNITY)
Admission: EM | Admit: 2023-08-23 | Discharge: 2023-08-23 | Disposition: A | Discharge status: Home / Self Care | Attending: Emergency Medicine | Admitting: Emergency Medicine

## 2023-08-23 ENCOUNTER — Other Ambulatory Visit: Payer: Self-pay

## 2023-08-23 DIAGNOSIS — W44F3XA Food entering into or through a natural orifice, initial encounter: Secondary | ICD-10-CM | POA: Insufficient documentation

## 2023-08-23 DIAGNOSIS — Z79899 Other long term (current) drug therapy: Secondary | ICD-10-CM | POA: Insufficient documentation

## 2023-08-23 DIAGNOSIS — Z7982 Long term (current) use of aspirin: Secondary | ICD-10-CM | POA: Diagnosis not present

## 2023-08-23 DIAGNOSIS — Z5941 Food insecurity: Secondary | ICD-10-CM | POA: Diagnosis not present

## 2023-08-23 DIAGNOSIS — R0602 Shortness of breath: Secondary | ICD-10-CM | POA: Insufficient documentation

## 2023-08-23 DIAGNOSIS — Z720 Tobacco use: Secondary | ICD-10-CM | POA: Diagnosis not present

## 2023-08-23 DIAGNOSIS — R0989 Other specified symptoms and signs involving the circulatory and respiratory systems: Secondary | ICD-10-CM | POA: Insufficient documentation

## 2023-08-23 DIAGNOSIS — T17320A Food in larynx causing asphyxiation, initial encounter: Secondary | ICD-10-CM

## 2023-08-23 LAB — URINALYSIS, W/ REFLEX TO CULTURE (INFECTION SUSPECTED)
Bilirubin Urine: NEGATIVE
Glucose, UA: NEGATIVE mg/dL
Ketones, ur: NEGATIVE mg/dL
Nitrite: POSITIVE — AB
Protein, ur: 100 mg/dL — AB
Specific Gravity, Urine: 1.009 (ref 1.005–1.030)
WBC, UA: >50 WBC/hpf (ref 0–5)
pH: 6 (ref 5.0–8.0)

## 2023-08-23 NOTE — ED Notes (Signed)
 Got patient into a gown on the monitor patient is resting with call bell in reach

## 2023-08-23 NOTE — ED Notes (Signed)
[facility] called no ETA

## 2023-08-23 NOTE — ED Notes (Signed)
 Pt sitting upright and drinking water .

## 2023-08-23 NOTE — ED Triage Notes (Signed)
 Patient from [facility] for eval post-choking on chicken nuggets. Staff did a few abdominal thrusts and food came out and patient is NAD. Patient had abnormal breath sounds per RN at facility but on EMS arrival breath sounds clear. Facility sending out for chest XR. Patient denies complaints.

## 2023-08-23 NOTE — ED Provider Notes (Signed)
 [place] EMERGENCY DEPARTMENT AT [facility] Provider Note   CSN: [id number] Arrival date & time: 08/23/23  1444     History {Add pertinent medical, surgical, social history, OB history to HPI:1} Chief Complaint  Patient presents with   Choking    PRESIDENT SCURTI is a 63 y.o. male.  Patient brought in by ambulance after a choking episode at his nursing facility.  He said he was eating chicken nuggets and something became stuck in his throat.  He could not breathe.  It sounds like staff did maneuvers to try to relieve his obstruction.  He says he feels like it passed now although he did not see any food regurgitated.  He said it feels like it is almost completely resolved now.  No difficulty speaking or breathing.  Has not tried to eat or drink anything yet though.  He said he has never had a choking episode before but he does feel like food gets stuck when he swallows sometimes.  The history is provided by the patient.  Shortness of Breath Severity:  Severe Onset quality:  Sudden Progression:  Resolved Chronicity:  New Relieved by: heimlic. Associated symptoms: no abdominal pain, no chest pain, no cough, no fever, no hemoptysis, no sputum production, no syncope and no wheezing   Risk factors: tobacco use        Home Medications Prior to Admission medications   Medication Sig Start Date End Date Taking? Authorizing Provider  acetaminophen  (TYLENOL ) 500 MG tablet Take 1,000 mg by mouth every 6 (six) hours as needed for moderate pain.    [provider]  albuterol  (VENTOLIN  HFA) 108 (90 Base) MCG/ACT inhaler Inhale 2 puffs into the lungs every 6 (six) hours as needed for shortness of breath. 05/29/11   [provider]  amLODipine (NORVASC) 5 MG tablet Take 2.5 mg by mouth daily.    [provider]  aspirin EC 81 MG tablet Take 81 mg by mouth daily. Swallow whole.    [provider]  atorvastatin (LIPITOR) 20 MG tablet Take 20 mg by mouth  at bedtime. 12/01/16   [provider]  azelastine  (ASTELIN ) 0.1 % nasal spray Place 2 sprays into both nostrils daily as needed for allergies or rhinitis. 07/11/15   [provider]  b complex vitamins capsule Take 1 capsule by mouth daily.    [provider]  cetirizine (ZYRTEC) 10 MG tablet Take 10 mg by mouth daily as needed for allergies.    [provider]  doxycycline (VIBRA-TABS) 100 MG tablet Take 100 mg by mouth 2 (two) times daily. 08/21/22   [provider]  fluticasone (FLONASE) 50 MCG/ACT nasal spray Place 1 spray into both nostrils daily as needed for allergies or rhinitis.    [provider]  fluticasone furoate-vilanterol (BREO ELLIPTA) 200-25 MCG/ACT AEPB Inhale 1 puff into the lungs daily as needed (shortness of breath).    [provider]  gabapentin  (NEURONTIN ) 800 MG tablet Take 800-1,600 mg by mouth See admin instructions. Take 800 mg in the morning and 1600 mg at night    [provider]  ibuprofen (ADVIL) 200 MG tablet Take 800 mg by mouth every 6 (six) hours as needed for moderate pain.    [provider]  ketoconazole (NIZORAL) 2 % shampoo Apply 1 Application topically daily. 08/17/21   [provider]  lamoTRIgine  (LAMICTAL ) 150 MG tablet Take 150 mg by mouth 2 (two) times daily. 08/17/21   [provider]  lithium  carbonate (LITHOBID) 300 MG ER tablet Take 600 mg by mouth at bedtime.    [provider]  naltrexone (DEPADE) 50 MG tablet Take 50 mg by mouth 2 (two) times daily. 08/17/21   [provider]  Olopatadine HCl (PATADAY OP) Place 1 drop into both eyes daily as needed (allergies).    [provider]  propranolol  ER (INDERAL  LA) 120 MG 24 hr capsule Take 120 mg by mouth at bedtime. 12/06/16   [provider]  QUEtiapine  (SEROQUEL ) 200 MG tablet Take 100-200 mg by mouth at bedtime.    [provider]      Allergies     Shellfish-derived products, Fructose, Linzess [linaclotide], and Sulfa antibiotics    Review of Systems   Review of Systems  Constitutional:  Negative for fever.  Respiratory:  Positive for shortness of breath. Negative for cough, hemoptysis, sputum production and wheezing.   Cardiovascular:  Negative for chest pain and syncope.  Gastrointestinal:  Negative for abdominal pain.    Physical Exam Updated Vital Signs BP 106/75 (BP Location: Right Arm)   Pulse 60   Temp 97.6 F (36.4 C) (Oral)   Resp 14   SpO2 100%  Physical Exam Vitals and nursing note reviewed.  Constitutional:      Appearance: Normal appearance. He is well-developed.  HENT:     Head: Normocephalic and atraumatic.     Mouth/Throat:     Mouth: Mucous membranes are moist.     Pharynx: Oropharynx is clear.  Eyes:     Conjunctiva/sclera: Conjunctivae normal.  Cardiovascular:     Rate and Rhythm: Normal rate and regular rhythm.     Heart sounds: No murmur heard. Pulmonary:     Effort: Pulmonary effort is normal. No respiratory distress.     Breath sounds: Normal breath sounds.  Abdominal:     Palpations: Abdomen is soft.     Tenderness: There is no abdominal tenderness. There is no guarding or rebound.  Musculoskeletal:        General: No deformity.     Cervical back: Neck supple.  Skin:    General: Skin is warm and dry.  Neurological:     General: No focal deficit present.     Mental Status: He is alert.     GCS: GCS eye subscore is 4. GCS verbal subscore is 5. GCS motor subscore is 6.     ED Results / Procedures / Treatments   Labs (all labs ordered are listed, but only abnormal results are displayed) Labs Reviewed - No data to display  EKG EKG Interpretation Date/Time:  Saturday Aug 23 2023 14:53:47 EDT Ventricular Rate:  58 PR Interval:  160 QRS Duration:  98 QT Interval:  414 QTC Calculation: 407 R Axis:   48  Text Interpretation: Sinus rhythm No significant change since prior 11/19  Confirmed by Racheal Buddle [phone]) on 08/23/2023 3:02:30 PM  Radiology No results found.  Procedures Procedures  {Document cardiac monitor, telemetry assessment procedure when appropriate:1}  Medications Ordered in ED Medications - No data to display  ED Course/ Medical Decision Making/ A&P   {   Click here for ABCD2, HEART and other calculatorsREFRESH Note before signing :1}                              Medical Decision Making Amount and/or Complexity of Data Reviewed Radiology: ordered.   This patient complains of ***; this involves an  extensive number of treatment Options and is a complaint that carries with it a high risk of complications and morbidity. The differential includes ***  I ordered, reviewed and interpreted labs, which included *** I ordered medication *** and reviewed PMP when indicated. I ordered imaging studies which included *** and I independently    visualized and interpreted imaging which showed *** Additional history obtained from *** Previous records obtained and reviewed *** I consulted *** and discussed lab and imaging findings and discussed disposition.  Cardiac monitoring reviewed, *** Social determinants considered, *** Critical Interventions: ***  After the interventions stated above, I reevaluated the patient and found *** Admission and further testing considered, ***   {Document critical care time when appropriate:1} {Document review of labs and clinical decision tools ie heart score, Chads2Vasc2 etc:1}  {Document your independent review of radiology images, and any outside records:1} {Document your discussion with family members, caretakers, and with consultants:1} {Document social determinants of health affecting pt's care:1} {Document your decision making why or why not admission, treatments were needed:1} Final Clinical Impression(s) / ED Diagnoses Final diagnoses:  None    Rx / DC Orders ED Discharge Orders     None

## 2023-08-23 NOTE — Discharge Instructions (Signed)
 You were seen in the emergency department after choking episode.  You were able to eat and drink here without any difficulty and your chest x-ray was clear.  Your urinalysis showed possible signs of infection and was sent for culture.  Please eat slowly and chew your food well.  Return if any difficulty breathing or swallowing.

## 2023-08-24 LAB — URINE CULTURE

## 2023-10-29 ENCOUNTER — Ambulatory Visit: Admitting: Podiatry

## 2024-01-12 ENCOUNTER — Encounter: Payer: Self-pay | Admitting: Podiatry

## 2024-01-12 ENCOUNTER — Ambulatory Visit (INDEPENDENT_AMBULATORY_CARE_PROVIDER_SITE_OTHER): Admitting: Podiatry

## 2024-01-12 DIAGNOSIS — M79674 Pain in right toe(s): Secondary | ICD-10-CM | POA: Diagnosis not present

## 2024-01-12 DIAGNOSIS — L608 Other nail disorders: Secondary | ICD-10-CM

## 2024-01-12 DIAGNOSIS — M79675 Pain in left toe(s): Secondary | ICD-10-CM

## 2024-01-12 DIAGNOSIS — B351 Tinea unguium: Secondary | ICD-10-CM

## 2024-01-12 NOTE — Progress Notes (Signed)
 This patient presents to my office for at risk foot care.  This patient requires this care by a professional since this patient will be at risk due to having kidney disease.  This patient is unable to cut nails himself since the patient cannot reach his nails.These nails are painful walking and wearing shoes.  This patient presents for at risk foot care today.  General Appearance  Alert, conversant and in no acute stress.  Vascular  Dorsalis pedis and posterior tibial  pulses are  weakly palpable  bilaterally.  Capillary return is within normal limits  bilaterally. Temperature is within normal limits  bilaterally.  Neurologic  Senn-Weinstein monofilament wire test within normal limits  bilaterally. Muscle power within normal limits bilaterally.  Nails Thick disfigured discolored nails with subungual debris  from hallux to fifth toes bilaterally. No evidence of bacterial infection or drainage bilaterally. Pincer nails  B/L.  Orthopedic  No limitations of motion  feet .  No crepitus or effusions noted.  No bony pathology or digital deformities noted.  Skin  normotropic skin with no porokeratosis noted bilaterally.  No signs of infections or ulcers noted.     Onychomycosis   Pincer Nails  Pain in right toes  Pain in left toes  IE.  Consent was obtained for treatment procedures.   Mechanical debridement of nails 1-5  bilaterally performed with a nail nipper.  Filed with dremel without incident.    Return office visit    3 months                  Told patient to return for periodic foot care and evaluation due to potential at risk complications.   Cordella Bold DPM

## 2024-02-22 ENCOUNTER — Emergency Department (HOSPITAL_COMMUNITY)
Admission: EM | Admit: 2024-02-22 | Discharge: 2024-02-23 | Disposition: A | Discharge status: Home / Self Care | Attending: Emergency Medicine | Admitting: Emergency Medicine

## 2024-02-22 ENCOUNTER — Other Ambulatory Visit: Payer: Self-pay

## 2024-02-22 ENCOUNTER — Encounter (HOSPITAL_COMMUNITY): Payer: Self-pay

## 2024-02-22 DIAGNOSIS — I10 Essential (primary) hypertension: Secondary | ICD-10-CM | POA: Insufficient documentation

## 2024-02-22 DIAGNOSIS — F17210 Nicotine dependence, cigarettes, uncomplicated: Secondary | ICD-10-CM | POA: Diagnosis not present

## 2024-02-22 DIAGNOSIS — R339 Retention of urine, unspecified: Secondary | ICD-10-CM | POA: Diagnosis present

## 2024-02-22 DIAGNOSIS — N3000 Acute cystitis without hematuria: Secondary | ICD-10-CM | POA: Diagnosis not present

## 2024-02-22 NOTE — ED Triage Notes (Signed)
 Pt has chronic history of urinary retention and inability to completely empty bladder, requiring him to self-cath. He states that he was unable to self cath tonight due to difficulty passing catheter.He is complaining of pain related to urinary retention and need to empty bladder.

## 2024-02-22 NOTE — ED Notes (Signed)
 Bladder scan inconclusive. Pt states that he is going to attempt to give us  a urine sample on his own.

## 2024-02-23 LAB — BASIC METABOLIC PANEL WITH GFR
Anion gap: 11 (ref 5–15)
BUN: 15 mg/dL (ref 8–23)
CO2: 25 mmol/L (ref 22–32)
Calcium: 9.1 mg/dL (ref 8.9–10.3)
Chloride: 105 mmol/L (ref 98–111)
Creatinine, Ser: 0.88 mg/dL (ref 0.61–1.24)
GFR, Estimated: >60 mL/min (ref >60)
Glucose, Bld: 95 mg/dL (ref 70–99)
Potassium: 3.6 mmol/L (ref 3.5–5.1)
Sodium: 141 mmol/L (ref 135–145)

## 2024-02-23 LAB — CBC
HCT: 39 % (ref 39.0–52.0)
Hemoglobin: 13 g/dL (ref 13.0–17.0)
MCH: 31.5 pg (ref 26.0–34.0)
MCHC: 33.3 g/dL (ref 30.0–36.0)
MCV: 94.4 fL (ref 80.0–100.0)
Platelets: 166 K/uL (ref 150–400)
RBC: 4.13 MIL/uL — ABNORMAL LOW (ref 4.22–5.81)
RDW: 13.3 % (ref 11.5–15.5)
WBC: 8.5 K/uL (ref 4.0–10.5)
nRBC: 0 % (ref 0.0–0.2)

## 2024-02-23 LAB — URINALYSIS, ROUTINE W REFLEX MICROSCOPIC
Bilirubin Urine: NEGATIVE
Glucose, UA: NEGATIVE mg/dL
Ketones, ur: 5 mg/dL — AB
Nitrite: NEGATIVE
Protein, ur: 100 mg/dL — AB
RBC / HPF: >50 RBC/hpf (ref 0–5)
Specific Gravity, Urine: 1.016 (ref 1.005–1.030)
WBC, UA: >50 WBC/hpf (ref 0–5)
pH: 6 (ref 5.0–8.0)

## 2024-02-23 MED ORDER — CIPROFLOXACIN HCL 500 MG PO TABS: 500.0000 mg | ORAL_TABLET | Freq: Two times a day (BID) | ORAL | 0 refills | Status: AC

## 2024-02-23 MED ORDER — CIPROFLOXACIN HCL 500 MG PO TABS
500.0000 mg | ORAL_TABLET | Freq: Once | ORAL | Status: AC
  Administered 2024-02-23: 500 mg via ORAL
  Filled 2024-02-23: qty 1

## 2024-02-23 MED ORDER — CEPHALEXIN 250 MG PO CAPS: 500.0000 mg | ORAL_CAPSULE | Freq: Once | ORAL | Status: DC

## 2024-02-23 NOTE — Discharge Instructions (Signed)
 You were evaluated in the Emergency Department and after careful evaluation, we did not find any emergent condition requiring admission or further testing in the hospital.  Your exam/testing today is overall reassuring.  Symptoms may be related to a UTI.  Maintain the Foley catheter at home and follow-up with your urologist for removal.  Take the ciprofloxacin antibiotic as directed.  Please return to the Emergency Department if you experience any worsening of your condition.   Thank you for allowing us  to be a part of your care.

## 2024-02-23 NOTE — ED Provider Notes (Signed)
 [facility]-EMERGENCY DEPT [facility] Emergency Department Provider Note MRN:  [id number]  Arrival date & time: 02/23/24     Chief Complaint   Urinary Retention   History of Present Illness   Colin Kelley is a 63 y.o. year-old male with a history of substance use disorder presenting to the ED with chief complaint of urinary retention.  Trouble with self cathing at home over the past few days, getting more more difficult.  Unable to do so this evening.  Has not fully emptied with self cath in about 24 hours.  Review of Systems  A thorough review of systems was obtained and all systems are negative except as noted in the HPI and PMH.   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 claudication    Spondylolisthesis    Substance induced mood disorder (HCC)     Past Surgical History:  Procedure Laterality Date   ankle surgery x 2 left     CYSTOSCOPY WITH BIOPSY Bilateral 09/12/2022   Procedure: CYSTOSCOPY WITH BIOPSY BILATERAL RETROGRADE PYELOGRAMS;  Surgeon: Nieves Cough, MD;  Location: [facility];  Service: Urology;  Laterality: Bilateral;   KNEE ARTHROSCOPY WITH MEDIAL MENISECTOMY Right 02/20/2018   Procedure: Right knee arthroscopy, partial medial and lateral  menisectomy, debridement;  Surgeon: Duwayne Purchase, MD;  Location: [facility];  Service: Orthopedics;  Laterality: Right;    prostectomy     SHOULDER SURGERY Right    TRANSURETHRAL RESECTION OF  BLADDER TUMOR N/A 09/12/2022   Procedure: TRANSURETHRAL RESECTION OF BLADDER TUMOR (TURBT);  Surgeon: Nieves Cough, MD;  Location: [facility];  Service: Urology;  Laterality: N/A;    History reviewed. No pertinent family history.  Social History   Socioeconomic History   Marital status: Single    Spouse name: Not on file   Number of children: Not on file   Years of education: Not on file   Highest education level: Not on file  Occupational History   Not on file  Tobacco Use   Smoking status: Every Day    Current packs/day: 1.50    Average packs/day: 1.5 packs/day for 10.0 years (15.0 ttl pk-yrs)    Types: Cigarettes   Smokeless tobacco: Former    Types: Chew  Vaping Use   Vaping status: Never Used  Substance and Sexual Activity   Alcohol use: No    Comment: no alcohol since 2009   Drug use: No    Comment: cocaine last used end of December 2023   Sexual activity: Not Currently  Other Topics Concern   Not on file  Social History Narrative   Admitted for altered mentation, lithium  toxicity. Lives at home with parents.    Social Drivers of Corporate Investment Banker Strain: Not on file  Food Insecurity: High Risk (04/21/2023)   Received from [facility]   Hunger Vital Sign  Within the past 12 months, you worried that your food would run out before you got money to buy more: Often true    Within the past 12 months, the food you bought just didn't last and you didn't have money to get more. : Often true  Transportation Needs: Unmet Transportation Needs (04/21/2023)   Received from [facility]    In the past 12 months, has lack of reliable transportation kept you from medical appointments, meetings, work or from getting things needed for daily living? : Yes  Physical Activity: Not on file  Stress: Not on file  Social Connections: Unknown (08/20/2021)   Received from [facility]   Social Network    Social Network: Not on file  Intimate Partner  Violence: Unknown (07/24/2021)   Received from [facility]   HITS    Physically Hurt: Not on file    Insult or Talk Down To: Not on file    Threaten Physical Harm: Not on file    Scream or Curse: Not on file     Physical Exam   Vitals:   02/22/24 2209  BP: (!) 142/91  Pulse: 75  Resp: 16  Temp: 98.7 F (37.1 C)  SpO2: 100%    CONSTITUTIONAL: Well-appearing, NAD NEURO/PSYCH:  Alert and oriented x 3, no focal deficits EYES:  eyes equal and reactive ENT/NECK:  no LAD, no JVD CARDIO: Regular rate, well-perfused, normal S1 and S2 PULM:  CTAB no wheezing or rhonchi GI/GU:  non-distended, non-tender MSK/SPINE:  No gross deformities, no edema SKIN:  no rash, atraumatic   *Additional and/or pertinent findings included in MDM below  Diagnostic and Interventional Summary    EKG Interpretation Date/Time:    Ventricular Rate:    PR Interval:    QRS Duration:    QT Interval:    QTC Calculation:   R Axis:      Text Interpretation:         Labs Reviewed  URINALYSIS, ROUTINE W REFLEX MICROSCOPIC - Abnormal; Notable for the following components:      Result Value   APPearance CLOUDY (*)    Hgb urine dipstick MODERATE (*)    Ketones, ur 5 (*)    Protein, ur 100 (*)    Leukocytes,Ua LARGE (*)    Bacteria, UA MANY (*)    All other components within normal limits  CBC - Abnormal; Notable for the following components:   RBC 4.13 (*)    All other components within normal limits  URINE CULTURE  BASIC METABOLIC PANEL WITH GFR    No orders to display    Medications  ciprofloxacin (CIPRO) tablet 500 mg (500 mg Oral Given 02/23/24 0035)     Procedures  /  Critical Care Procedures  ED Course and Medical Decision Making  Initial Impression and Ddx Differential diagnosis includes UTI, worsening urethral stricture, acute kidney injury  Past medical/surgical history that increases complexity of ED encounter: Self caths at home  Interpretation of Diagnostics I personally  reviewed the Laboratory Testing and my interpretation is as follows: No significant blood count or electrolyte disturbance.  Urinalysis consistent with infection    Patient Reassessment and Ultimate Disposition/Management     We discussed the pros and cons of Foley catheter placement and patient agrees with Foley, he has follow-up with his urologist in about a week.  Given his culture data in the past discharging home on ciprofloxacin.  Patient management required discussion with the following services or consulting groups:  None  Complexity of Problems Addressed Acute illness or injury that poses threat of life of bodily function  Additional Data Reviewed and Analyzed Further history obtained from: None  Additional Factors Impacting ED Encounter Risk Consideration of hospitalization  Colin Kelley. Theadore, MD [facility] Emergency Medicine [facility] [email or web address]  Final Clinical Impressions(s) / ED Diagnoses     ICD-10-CM   1. Urinary retention  R33.9     2. Acute cystitis without hematuria  N30.00       ED Discharge Orders          Ordered    ciprofloxacin (CIPRO) 500 MG tablet  Every 12 hours        02/23/24 0209             Discharge Instructions Discussed with and Provided to Patient:     Discharge Instructions      You were evaluated in the Emergency Department and after careful evaluation, we did not find any emergent condition requiring admission or further testing in the hospital.  Your exam/testing today is overall reassuring.  Symptoms may be related to a UTI.  Maintain the Foley catheter at home and follow-up with your urologist for removal.  Take the ciprofloxacin antibiotic as directed.  Please return to the Emergency Department if you experience any worsening of your condition.   Thank you for allowing us  to be a part of your care.       Theadore Colin HERO, MD 02/23/24 [phone]

## 2024-02-25 LAB — URINE CULTURE: Culture: >=100000 — AB

## 2024-02-26 ENCOUNTER — Telehealth (HOSPITAL_BASED_OUTPATIENT_CLINIC_OR_DEPARTMENT_OTHER): Payer: Self-pay

## 2024-02-26 NOTE — Telephone Encounter (Signed)
 Post ED Visit - Positive Culture Follow-up  Culture report reviewed by antimicrobial stewardship pharmacist: [facility] [x]  Maurilio Patten, Pharm.D. []  Venetia Gully, Pharm.D., BCPS AQ-ID []  Garrel Crews, Pharm.D., BCPS []  Almarie Lunger, [address].D., BCPS []  [place], [address].D., BCPS, AAHIVP []  Rosaline Bihari, Pharm.D., BCPS, AAHIVP []  Vernell Meier, PharmD, BCPS []  Latanya Hint, PharmD, BCPS []  Donald Medley, PharmD, BCPS []  Rocky Bold, PharmD []  Dorothyann Alert, PharmD, BCPS []  Morene Babe, PharmD  [facility] []  Rosaline Edison, PharmD []  Romona Bliss, PharmD []  Dolphus Roller, PharmD []  Veva Seip, Rph []  Vernell Daunt) Leonce, PharmD []  Eva Allis, PharmD []  Rosaline Millet, PharmD []  Iantha Batch, PharmD []  Arvin Gauss, PharmD []  Wanda Hasting, PharmD []  Ronal Rav, PharmD []  Rocky Slade, PharmD []  Bard Jeans, PharmD   Positive urine culture Treated with Ciprofloxacin, organism sensitive to the same and no further patient follow-up is required at this time.  Ruth Camelia Elbe 02/26/2024, 10:18 AM

## 2024-04-12 ENCOUNTER — Ambulatory Visit: Admitting: Podiatry
